# Patient Record
Sex: Female | Born: 1980 | Race: Asian | Hispanic: No | Marital: Married | Smoking: Never smoker
Health system: Southern US, Community
[De-identification: ages and names within clinical notes are randomized; demographics above are authoritative.]

## PROBLEM LIST (undated history)

## (undated) DIAGNOSIS — O09522 Supervision of elderly multigravida, second trimester: Secondary | ICD-10-CM

## (undated) DIAGNOSIS — Z789 Other specified health status: Principal | ICD-10-CM

## (undated) DIAGNOSIS — I10 Essential (primary) hypertension: Secondary | ICD-10-CM

## (undated) HISTORY — DX: Other specified health status: Z78.9

## (undated) HISTORY — DX: Supervision of elderly multigravida, second trimester: O09.522

## (undated) HISTORY — DX: Essential (primary) hypertension: I10

## (undated) HISTORY — PX: NO PAST SURGERIES: SHX2092

---

## 2008-09-10 ENCOUNTER — Inpatient Hospital Stay (HOSPITAL_COMMUNITY): Admission: AD | Admit: 2008-09-10 | Discharge: 2008-09-12 | Payer: Self-pay | Admitting: Obstetrics and Gynecology

## 2009-12-03 ENCOUNTER — Encounter: Payer: Self-pay | Admitting: Physician Assistant

## 2009-12-03 ENCOUNTER — Ambulatory Visit: Payer: Self-pay | Admitting: Obstetrics and Gynecology

## 2009-12-03 LAB — CONVERTED CEMR LAB
Eosinophils Relative: 13 % — ABNORMAL HIGH (ref 0–5)
HCT: 30.5 % — ABNORMAL LOW (ref 36.0–46.0)
Hemoglobin: 9.5 g/dL — ABNORMAL LOW (ref 12.0–15.0)
Hepatitis B Surface Ag: NEGATIVE
Lymphs Abs: 2.1 10*3/uL (ref 0.7–4.0)
MCV: 55.7 fL — ABNORMAL LOW (ref 78.0–100.0)
Monocytes Relative: 6 % (ref 3–12)
Neutro Abs: 4.1 10*3/uL (ref 1.7–7.7)
Neutrophils Relative %: 53 % (ref 43–77)
Platelets: 211 10*3/uL (ref 150–400)

## 2009-12-08 ENCOUNTER — Ambulatory Visit (HOSPITAL_COMMUNITY): Admission: RE | Admit: 2009-12-08 | Discharge: 2009-12-08 | Payer: Self-pay | Admitting: Family Medicine

## 2009-12-10 ENCOUNTER — Ambulatory Visit: Payer: Self-pay | Admitting: Obstetrics and Gynecology

## 2009-12-10 ENCOUNTER — Encounter (INDEPENDENT_AMBULATORY_CARE_PROVIDER_SITE_OTHER): Payer: Self-pay | Admitting: Family Medicine

## 2009-12-11 ENCOUNTER — Encounter (INDEPENDENT_AMBULATORY_CARE_PROVIDER_SITE_OTHER): Payer: Self-pay | Admitting: Family Medicine

## 2009-12-11 ENCOUNTER — Observation Stay (HOSPITAL_COMMUNITY): Admission: RE | Admit: 2009-12-11 | Discharge: 2009-12-11 | Payer: Self-pay | Admitting: Family Medicine

## 2009-12-16 ENCOUNTER — Inpatient Hospital Stay (HOSPITAL_COMMUNITY): Admission: AD | Admit: 2009-12-16 | Discharge: 2009-12-18 | Payer: Self-pay | Admitting: Obstetrics & Gynecology

## 2009-12-16 ENCOUNTER — Ambulatory Visit: Payer: Self-pay | Admitting: Obstetrics & Gynecology

## 2010-02-19 ENCOUNTER — Ambulatory Visit: Payer: Self-pay | Admitting: Obstetrics & Gynecology

## 2010-05-07 ENCOUNTER — Ambulatory Visit
Admission: RE | Admit: 2010-05-07 | Discharge: 2010-05-07 | Payer: Self-pay | Source: Home / Self Care | Attending: Obstetrics and Gynecology | Admitting: Obstetrics and Gynecology

## 2010-07-10 LAB — POCT URINALYSIS DIPSTICK
Bilirubin Urine: NEGATIVE
Ketones, ur: NEGATIVE mg/dL
Nitrite: NEGATIVE
Protein, ur: NEGATIVE mg/dL
Protein, ur: NEGATIVE mg/dL
Specific Gravity, Urine: <=1.005 (ref 1.005–1.030)
Specific Gravity, Urine: 1.01 (ref 1.005–1.030)
Urobilinogen, UA: 0.2 mg/dL (ref 0.0–1.0)
pH: 5 (ref 5.0–8.0)

## 2010-07-10 LAB — CBC
HCT: 30.6 % — ABNORMAL LOW (ref 36.0–46.0)
MCH: 18 pg — ABNORMAL LOW (ref 26.0–34.0)
MCV: 57.3 fL — ABNORMAL LOW (ref 78.0–100.0)
Platelets: 124 10*3/uL — ABNORMAL LOW (ref 150–400)
RDW: 22.7 % — ABNORMAL HIGH (ref 11.5–15.5)
WBC: 16.1 10*3/uL — ABNORMAL HIGH (ref 4.0–10.5)

## 2010-07-10 LAB — AMNISURE RUPTURE OF MEMBRANE (ROM) NOT AT ARMC: Amnisure ROM: POSITIVE

## 2010-07-10 LAB — RPR: RPR Ser Ql: NONREACTIVE

## 2010-07-24 ENCOUNTER — Ambulatory Visit (INDEPENDENT_AMBULATORY_CARE_PROVIDER_SITE_OTHER): Payer: Medicaid Other

## 2010-07-24 DIAGNOSIS — Z3049 Encounter for surveillance of other contraceptives: Secondary | ICD-10-CM

## 2010-08-04 LAB — CBC
Hemoglobin: 9.7 g/dL — ABNORMAL LOW (ref 12.0–15.0)
MCV: 67.7 fL — ABNORMAL LOW (ref 78.0–100.0)
Platelets: 153 10*3/uL (ref 150–400)
Platelets: 183 10*3/uL (ref 150–400)
RBC: 5.56 MIL/uL — ABNORMAL HIGH (ref 3.87–5.11)
RDW: 19.7 % — ABNORMAL HIGH (ref 11.5–15.5)
RDW: 20.1 % — ABNORMAL HIGH (ref 11.5–15.5)
WBC: 15.8 10*3/uL — ABNORMAL HIGH (ref 4.0–10.5)
WBC: 9.2 10*3/uL (ref 4.0–10.5)

## 2010-09-08 NOTE — H&P (Signed)
NAMEKAILYN, VANDERSLICE                  ACCOUNT NO.:  [id number]   MEDICAL RECORD NO.:  [id number]          PATIENT TYPE:  INP   LOCATION:  9169                          FACILITY:  [facility]   PHYSICIAN:  Osborn Coho, M.D.   DATE OF BIRTH:  1981-02-02   DATE OF ADMISSION:  09/10/2008  DATE OF DISCHARGE:                              HISTORY & PHYSICAL   Ms. Deakins is a 30 year old gravida 3, para 2-0-0-2, at 40-6/7 weeks who  presented complaining of uterine contractions every 5 minutes for  several hours.  She denies any leaking or bleeding, and reports positive  fetal movement.  Cervix has been 1 cm in the office.  Group B strep  culture has been negative.  Pregnancy has been remarkable for:   1. Language barrier with patient being Asian.  She does have an      interpreter with her.  2. Group B strep negative.  3. Two previous deliveries in [place] without complications.  4. Late to care.   PRENATAL LABORATORIES:  Blood type is A+, Rh antibody negative, VDRL  nonreactive, __________ titer positive, hepatitis B surface antigen  negative, HIV is nonreactive.  GC and chlamydia cultures were negative  in March.  Glucola was normal.  Hemoglobin upon entering the practice  was 11.6.  It was 10.7 at 29 weeks.  The patient had a group B strep,  GC and chlamydia culture done at 38 weeks.  These were all negative.  The patient presented for care too late for first trimester or second  trimester screening.   HISTORY OF PRESENT PREGNANCY:  The patient entered care at [facility] at 23-5/7 weeks.  Dating was confirmed by ultrasound secondary  to questionable LMP dating.  She had a Glucola at her second visit.  These were normal.  Hemoglobin was 10.7 at 30-31 weeks.  She was  recommended to start iron.  She had __________ in between 31 weeks and  38 weeks.  At 38 weeks she was seen.  Was measuring 33 weeks' size.  She  had an ultrasound showing estimated fetal weight of 6 pounds 8 ounces at  the  26th percentile with normal fluid, and average gestational age was  diagnosed.  Her cervix at that time was closed, 50% vertex, -2.  GBS,  GC, and chlamydia cultures were done.  These were all negative.  The  rest of her pregnancy was essentially uncomplicated.   OBSTETRICAL HISTORY:  In 2001 and in 2003 she had vaginal deliveries in  [place].  These were both home births and had no prenatal care.  She had  no complications per the patient's report.  She did carry both babies to  essentially full term.  She is unsure of the weight.   She has no known medication allergies.   MEDICAL HISTORY:  She has a history of varicose veins.   FAMILY HISTORY:  Noncontributory with no history of hypertension,  diabetes or any other issues.   The patient has never had any surgery.   GENETIC HISTORY:  Remarkable for father of the baby having  mental  retardation.   SOCIAL HISTORY:  The patient is married to the father of the baby.  He  is involved and supportive.  His name is  Merchandiser, retail.  The patient is of  [place] in ethnicity.  She denies religious affiliation.  She speaks  [place].  Her interpreter is Hgiem Ayum.  The patient is currently  unemployed.  Her husband is employed in [employer].  She has  been followed by the Certified Nurse Midwife Service at [facility].  She denies any alcohol, drug or tobacco use during this pregnancy.   PHYSICAL EXAMINATION:  VITAL SIGNS:  Stable.  The patient is afebrile.  HEENT:  Within normal limits.  LUNGS:  Breath sounds are clear.  HEART:  Regular rate and rhythm without murmur.  BREASTS:  Soft and nontender.  ABDOMEN:  Fundal height is approximately 35 cm.  Estimated fetal weight  is 6-7 pounds.  Uterine contractions are currently at present irregular  and mild to moderate.  Fetal heart rate is reactive in cycles with no  decelerations.  Cervix on admission was 2, 60%, vertex -1.  EXTREMITIES:  Deep tendon reflexes are 2+ without  clonus.  There is a trace edema  noted.   IMPRESSION:  1. Intrauterine pregnancy at 40-6/7 weeks.  2. Limited English.  3. Group B strep negative.  4. Early versus prodromal labor.   PLAN:  1. Admitted to birthing suite per consult with Dr. Su Hilt, his      attending physician.  I reviewed the options with the patient and      her interpreter regarding discharge home with a return with      increased contractions or full admission to the hospital with      artificial rupture of membranes as an option.  The patient did wish      to remain in light of her language barrier, her multiparous status,      and her post date status, and elected to have artificial rupture of      membranes.  I agree with this plan.  2. Routine certified nurse midwife orders.  3. Plan IV initiation with artificial rupture of membranes.  Pitocin      __________ will be used p.r.n.  __________ status was reviewed with      the patient interpreter, and they are agreeable with the plan.      Renaldo Reel Emilee Hero, C.N.M.      Osborn Coho, M.D.  Electronically Signed    VLL/MEDQ  D:  09/10/2008  T:  09/10/2008  Job:  [id number]

## 2010-10-09 ENCOUNTER — Ambulatory Visit (INDEPENDENT_AMBULATORY_CARE_PROVIDER_SITE_OTHER): Payer: Medicaid Other

## 2010-10-09 DIAGNOSIS — Z3049 Encounter for surveillance of other contraceptives: Secondary | ICD-10-CM

## 2010-12-11 ENCOUNTER — Encounter: Payer: Self-pay | Admitting: *Deleted

## 2010-12-25 ENCOUNTER — Ambulatory Visit (INDEPENDENT_AMBULATORY_CARE_PROVIDER_SITE_OTHER): Payer: Medicaid Other

## 2010-12-25 VITALS — BP 133/93 | HR 92

## 2010-12-25 DIAGNOSIS — IMO0001 Reserved for inherently not codable concepts without codable children: Secondary | ICD-10-CM

## 2010-12-25 DIAGNOSIS — Z3049 Encounter for surveillance of other contraceptives: Secondary | ICD-10-CM

## 2010-12-25 MED ORDER — MEDROXYPROGESTERONE ACETATE 150 MG/ML IM SUSP
150.0000 mg | Freq: Once | INTRAMUSCULAR | Status: AC
  Administered 2010-12-25: 150 mg via INTRAMUSCULAR

## 2010-12-25 NOTE — Progress Notes (Signed)
Pt was informed via Education officer, community # [phone] that she would need to make a follow up appt involving the Depo Provera regimen that she will have been on it for a year to see if still works for her. I also informed her that the front office will call her with her appt.  Pt stated understanding.

## 2011-05-12 ENCOUNTER — Ambulatory Visit (INDEPENDENT_AMBULATORY_CARE_PROVIDER_SITE_OTHER): Payer: Medicaid Other | Admitting: *Deleted

## 2011-05-12 VITALS — BP 140/99 | HR 84 | Temp 98.7°F

## 2011-05-12 DIAGNOSIS — Z3049 Encounter for surveillance of other contraceptives: Secondary | ICD-10-CM

## 2011-05-12 MED ORDER — MEDROXYPROGESTERONE ACETATE 150 MG/ML IM SUSP
150.0000 mg | Freq: Once | INTRAMUSCULAR | Status: AC
  Administered 2011-05-12: 150 mg via INTRAMUSCULAR

## 2011-05-12 NOTE — Progress Notes (Signed)
Addended by: Sherre Lain A on: 05/12/2011 04:04 PM   Modules accepted: Level of Service

## 2011-05-12 NOTE — Progress Notes (Signed)
Pt in for depo provera on schedule. Will need to see provider prior to next injection.

## 2011-05-12 NOTE — Progress Notes (Signed)
Pts bp elevated today spoke with Wynelle Bourgeois, CNM. She said okay to give depo provera however pt needs a pcp. Spoke with pt using language line gave her info for [facility] to follow up on BP. Pt voices understanding. Also advised that she needs to see MD for her yearly checkup.

## 2011-06-02 ENCOUNTER — Ambulatory Visit (INDEPENDENT_AMBULATORY_CARE_PROVIDER_SITE_OTHER): Payer: Medicaid Other | Admitting: Physician Assistant

## 2011-06-02 ENCOUNTER — Encounter: Payer: Self-pay | Admitting: Physician Assistant

## 2011-06-02 DIAGNOSIS — Z603 Acculturation difficulty: Secondary | ICD-10-CM

## 2011-06-02 DIAGNOSIS — Z0000 Encounter for general adult medical examination without abnormal findings: Secondary | ICD-10-CM

## 2011-06-02 DIAGNOSIS — Z609 Problem related to social environment, unspecified: Secondary | ICD-10-CM

## 2011-06-02 DIAGNOSIS — I10 Essential (primary) hypertension: Secondary | ICD-10-CM

## 2011-06-02 DIAGNOSIS — Z789 Other specified health status: Secondary | ICD-10-CM

## 2011-06-02 HISTORY — DX: Other specified health status: Z78.9

## 2011-06-02 HISTORY — DX: Acculturation difficulty: Z60.3

## 2011-06-02 MED ORDER — HYDROCHLOROTHIAZIDE 25 MG PO TABS: 25.0000 mg | ORAL_TABLET | Freq: Every day | ORAL | Status: DC

## 2011-06-02 NOTE — Progress Notes (Signed)
Chief Complaint:  Gynecologic Exam   Renee Carson is  31 y.o. [id number].  No LMP recorded. Patient has had an injection..   She presents complaining of Gynecologic Exam  No complaints. Last pap 04/2009 no hx of abnormals. No periods with depo.  Obstetrical/Gynecological History: OB History    Grav Para Term Preterm Abortions TAB SAB Ect Mult Living   4 4 4       4       Past Medical History: Past Medical History  Diagnosis Date  . Language Barrier 06/02/2011    Past Surgical History: History reviewed. No pertinent past surgical history.  Family History: History reviewed. No pertinent family history.  Social History: History  Substance Use Topics  . Smoking status: Never Smoker   . Smokeless tobacco: Not on file  . Alcohol Use: No    Allergies: No Known Allergies   Review of Systems - History obtained from chart review and the patient General ROS: negative Psychological ROS: negative Endocrine ROS: negative Breast ROS: negative for breast lumps Respiratory ROS: no cough, shortness of breath, or wheezing Cardiovascular ROS: no chest pain or dyspnea on exertion Gastrointestinal ROS: no abdominal pain, change in bowel habits, or black or bloody stools Genito-Urinary ROS: no dysuria, trouble voiding, or hematuria No period secondary   Physical Exam   Blood pressure 155/101, pulse 123, temperature 97.7 F (36.5 C), temperature source Oral, resp. rate 20, height 5\' 2"  (1.575 m), weight 129 lb 1.6 oz (58.559 kg).  General: General appearance - alert, well appearing, and in no distress, oriented to person, place, and time and normal appearing weight Mental status - alert, oriented to person, place, and time, normal mood, behavior, speech, dress, motor activity, and thought processes, affect appropriate to mood Nose - normal and patent, no erythema, discharge or polyps Mouth - mucous membranes moist, pharynx normal without lesions Neck - supple, no significant adenopathy,  carotids upstroke normal bilaterally, no bruits Lymphatics - no palpable lymphadenopathy, no hepatosplenomegaly Chest - clear to auscultation, no wheezes, rales or rhonchi, symmetric air entry Heart - normal rate, regular rhythm, normal S1, S2, no murmurs, rubs, clicks or gallops Abdomen - soft, nontender, nondistended, no masses or organomegaly Breasts - breasts appear normal, no suspicious masses, no skin or nipple changes or axillary nodes Pelvic - VULVA: normal appearing vulva with no masses, tenderness or lesions, CERVIX: cervical motion tenderness absent, UTERUS: uterus is normal size, shape, consistency and nontender, ADNEXA: normal adnexa in size, nontender and no masses Back exam - full range of motion, no tenderness, palpable spasm or pain on motion Neurological - alert, oriented, normal speech, no focal findings or movement disorder noted, screening mental status exam normal, cranial nerves II through XII intact Musculoskeletal - no joint tenderness, deformity or swelling Extremities - peripheral pulses normal, no pedal edema, no clubbing or cyanosis Skin - facial melasma    Assessment: Well Woman Exam w/o pap Patient Active Problem List  Diagnoses  . Language Barrier  . Hypertension    Plan: Rx HCTZ 25mg  daily Referral to [facility] for PCP needs and HTN management RTC 1 year or prn probs. Pap due 04/2012  Renee Carson E. 06/02/2011,4:30 PM

## 2011-06-02 NOTE — Progress Notes (Signed)
Pt was seen @ our clinic last month for depo provera injection and had elevated blood pressure. She does not have a PCP. She has [employer].  Her last annual exam was Jan 2012.

## 2011-06-02 NOTE — Patient Instructions (Signed)

## 2011-07-28 ENCOUNTER — Ambulatory Visit (INDEPENDENT_AMBULATORY_CARE_PROVIDER_SITE_OTHER): Payer: Medicaid Other

## 2011-07-28 VITALS — BP 139/92

## 2011-07-28 DIAGNOSIS — Z3049 Encounter for surveillance of other contraceptives: Secondary | ICD-10-CM

## 2011-07-28 MED ORDER — MEDROXYPROGESTERONE ACETATE 150 MG/ML IM SUSP
150.0000 mg | Freq: Once | INTRAMUSCULAR | Status: AC
  Administered 2011-07-28: 150 mg via INTRAMUSCULAR

## 2011-10-15 ENCOUNTER — Ambulatory Visit: Payer: Self-pay

## 2011-11-04 ENCOUNTER — Other Ambulatory Visit (INDEPENDENT_AMBULATORY_CARE_PROVIDER_SITE_OTHER): Payer: Self-pay

## 2011-11-04 VITALS — BP 143/97 | HR 82 | Temp 97.0°F | Resp 16 | Wt 126.2 lb

## 2011-11-04 DIAGNOSIS — Z3049 Encounter for surveillance of other contraceptives: Secondary | ICD-10-CM

## 2011-11-04 MED ORDER — MEDROXYPROGESTERONE ACETATE 150 MG/ML IM SUSP
150.0000 mg | Freq: Once | INTRAMUSCULAR | Status: AC
  Administered 2011-11-04: 150 mg via INTRAMUSCULAR

## 2012-01-20 ENCOUNTER — Ambulatory Visit (INDEPENDENT_AMBULATORY_CARE_PROVIDER_SITE_OTHER): Payer: Self-pay | Admitting: Medical

## 2012-01-20 VITALS — BP 140/95

## 2012-01-20 DIAGNOSIS — I10 Essential (primary) hypertension: Secondary | ICD-10-CM

## 2012-01-20 DIAGNOSIS — Z3049 Encounter for surveillance of other contraceptives: Secondary | ICD-10-CM

## 2012-01-20 DIAGNOSIS — Z3042 Encounter for surveillance of injectable contraceptive: Secondary | ICD-10-CM

## 2012-01-20 MED ORDER — MEDROXYPROGESTERONE ACETATE 150 MG/ML IM SUSP
150.0000 mg | INTRAMUSCULAR | Status: DC
  Administered 2012-01-20: 150 mg via INTRAMUSCULAR

## 2012-01-20 MED ORDER — HYDROCHLOROTHIAZIDE 25 MG PO TABS: 25.0000 mg | ORAL_TABLET | Freq: Every day | ORAL | Status: DC

## 2012-04-06 ENCOUNTER — Ambulatory Visit: Payer: Self-pay

## 2012-08-08 ENCOUNTER — Ambulatory Visit (INDEPENDENT_AMBULATORY_CARE_PROVIDER_SITE_OTHER): Payer: Medicaid Other

## 2012-08-08 VITALS — BP 130/93 | HR 90 | Temp 97.8°F | Ht 62.0 in | Wt 127.0 lb

## 2012-08-08 DIAGNOSIS — Z3042 Encounter for surveillance of injectable contraceptive: Secondary | ICD-10-CM

## 2012-08-08 DIAGNOSIS — Z3049 Encounter for surveillance of other contraceptives: Secondary | ICD-10-CM

## 2012-08-08 MED ORDER — MEDROXYPROGESTERONE ACETATE 150 MG/ML IM SUSP
150.0000 mg | Freq: Once | INTRAMUSCULAR | Status: AC
  Administered 2012-08-08: 150 mg via INTRAMUSCULAR

## 2012-10-24 ENCOUNTER — Ambulatory Visit (INDEPENDENT_AMBULATORY_CARE_PROVIDER_SITE_OTHER): Payer: Medicaid Other | Admitting: *Deleted

## 2012-10-24 VITALS — BP 141/88 | HR 80 | Temp 97.0°F | Ht 62.0 in | Wt 125.0 lb

## 2012-10-24 DIAGNOSIS — Z3049 Encounter for surveillance of other contraceptives: Secondary | ICD-10-CM

## 2012-10-24 MED ORDER — MEDROXYPROGESTERONE ACETATE 150 MG/ML IM SUSP
150.0000 mg | Freq: Once | INTRAMUSCULAR | Status: AC
  Administered 2012-10-24: 150 mg via INTRAMUSCULAR

## 2012-10-24 NOTE — Progress Notes (Signed)
Pt informed that prior to her next injection she needs to call for an annual exam. Pt agrees.

## 2013-01-10 ENCOUNTER — Ambulatory Visit (INDEPENDENT_AMBULATORY_CARE_PROVIDER_SITE_OTHER): Payer: Medicaid Other | Admitting: General Practice

## 2013-01-10 VITALS — BP 134/89 | HR 99 | Temp 97.7°F | Ht 62.0 in | Wt 117.4 lb

## 2013-01-10 DIAGNOSIS — Z3049 Encounter for surveillance of other contraceptives: Secondary | ICD-10-CM

## 2013-01-10 DIAGNOSIS — IMO0001 Reserved for inherently not codable concepts without codable children: Secondary | ICD-10-CM

## 2013-01-10 MED ORDER — MEDROXYPROGESTERONE ACETATE 150 MG/ML IM SUSP
150.0000 mg | Freq: Once | INTRAMUSCULAR | Status: AC
  Administered 2013-01-10: 150 mg via INTRAMUSCULAR

## 2013-01-31 ENCOUNTER — Encounter: Payer: Self-pay | Admitting: Family Medicine

## 2013-01-31 ENCOUNTER — Other Ambulatory Visit (HOSPITAL_COMMUNITY)
Admission: RE | Admit: 2013-01-31 | Discharge: 2013-01-31 | Disposition: A | Discharge status: Home / Self Care | Payer: Medicaid Other | Source: Ambulatory Visit | Attending: Family Medicine | Admitting: Family Medicine

## 2013-01-31 ENCOUNTER — Ambulatory Visit (INDEPENDENT_AMBULATORY_CARE_PROVIDER_SITE_OTHER): Payer: Medicaid Other | Admitting: Family Medicine

## 2013-01-31 VITALS — BP 125/87 | HR 93 | Temp 97.7°F | Wt 114.9 lb

## 2013-01-31 DIAGNOSIS — Z609 Problem related to social environment, unspecified: Secondary | ICD-10-CM

## 2013-01-31 DIAGNOSIS — Z0000 Encounter for general adult medical examination without abnormal findings: Secondary | ICD-10-CM

## 2013-01-31 DIAGNOSIS — Z01419 Encounter for gynecological examination (general) (routine) without abnormal findings: Secondary | ICD-10-CM | POA: Insufficient documentation

## 2013-01-31 DIAGNOSIS — Z1151 Encounter for screening for human papillomavirus (HPV): Secondary | ICD-10-CM | POA: Insufficient documentation

## 2013-01-31 DIAGNOSIS — Z23 Encounter for immunization: Secondary | ICD-10-CM

## 2013-01-31 DIAGNOSIS — Z789 Other specified health status: Secondary | ICD-10-CM

## 2013-01-31 MED ORDER — TETANUS-DIPHTH-ACELL PERTUSSIS 5-2.5-18.5 LF-MCG/0.5 IM SUSP
0.5000 mL | Freq: Once | INTRAMUSCULAR | Status: AC
  Administered 2013-01-31: 0.5 mL via INTRAMUSCULAR

## 2013-01-31 NOTE — Patient Instructions (Addendum)
It was great to meet you!

## 2013-01-31 NOTE — Progress Notes (Signed)
  Subjective:     Renee Carson is a 32 y.o. female here for a routine female exam.  Current complaints: none.    Pt has a hx of HTN was on HCTZ but was taken off as her BP was back to normal.    No fevers, chills, HA, cp, sob, abd pain, LEE.    Gynecologic History No LMP recorded. Patient has had an injection. Contraception: Depo-Provera injections Last Pap: 04/2010 and normal. Results were: normal Last mammogram: n/a.   Obstetric History OB History  Gravida Para Term Preterm AB SAB TAB Ectopic Multiple Living  4 4 4       4     # Outcome Date GA Lbr Len/2nd Weight Sex Delivery Anes PTL Lv  4 TRM           3 TRM           2 TRM           1 TRM              Past Medical History  Diagnosis Date  . Language barrier 06/02/2011   History   Social History  . Marital Status: Married    Spouse Name: N/A    Number of Children: N/A  . Years of Education: N/A   Occupational History  . Not on file.   Social History Main Topics  . Smoking status: Never Smoker   . Smokeless tobacco: Not on file  . Alcohol Use: No  . Drug Use: No  . Sexual Activity: Yes    Birth Control/ Protection: Injection   Other Topics Concern  . Not on file   Social History Narrative  . No narrative on file   No family history on file.    Review of Systems A comprehensive review of systems was negative.    Objective:  BP 148/92  Pulse 92  Temp(Src) 97.7 F (36.5 C) (Oral)  Wt 114 lb 14.4 oz (52.118 kg)  BMI 21.01 kg/m2 GENERAL: Well-developed, well-nourished female in no acute distress.  HEENT: Normocephalic, atraumatic. Sclerae anicteric.  NECK: Supple. Normal thyroid.  LUNGS: Clear to auscultation bilaterally.  HEART: Regular rate and rhythm. BREASTS: Symmetric in size. No masses, skin changes, nipple drainage, or lymphadenopathy. ABDOMEN: Soft, nontender, nondistended. No organomegaly. PELVIC: Normal external female genitalia. Vagina is pink and rugated.  Normal discharge. Normal cervix  contour. Pap smear obtained. Uterus is normal in size. No adnexal mass or tenderness.  EXTREMITIES: No cyanosis, clubbing, or edema, 2+ distal pulses.       Assessment:    Healthy female exam.    Plan:    Education reviewed: calcium supplements and hypertension guidelines. PAP done today F/u in 1 year as scheduled TDAP given today

## 2013-02-02 ENCOUNTER — Encounter: Payer: Self-pay | Admitting: *Deleted

## 2013-03-28 ENCOUNTER — Ambulatory Visit (INDEPENDENT_AMBULATORY_CARE_PROVIDER_SITE_OTHER): Payer: Medicaid Other

## 2013-03-28 VITALS — BP 133/92 | HR 76 | Wt 117.2 lb

## 2013-03-28 DIAGNOSIS — Z3049 Encounter for surveillance of other contraceptives: Secondary | ICD-10-CM

## 2013-03-28 MED ORDER — MEDROXYPROGESTERONE ACETATE 104 MG/0.65ML ~~LOC~~ SUSP
104.0000 mg | Freq: Once | SUBCUTANEOUS | Status: AC
  Administered 2013-03-28: 104 mg via SUBCUTANEOUS

## 2013-06-22 ENCOUNTER — Emergency Department (INDEPENDENT_AMBULATORY_CARE_PROVIDER_SITE_OTHER)
Admission: EM | Admit: 2013-06-22 | Discharge: 2013-06-22 | Disposition: A | Discharge status: Home / Self Care | Payer: Worker's Compensation | Source: Home / Self Care | Attending: Emergency Medicine | Admitting: Emergency Medicine

## 2013-06-22 ENCOUNTER — Encounter (HOSPITAL_COMMUNITY): Payer: Self-pay | Admitting: Emergency Medicine

## 2013-06-22 ENCOUNTER — Ambulatory Visit: Payer: Medicaid Other

## 2013-06-22 DIAGNOSIS — S61209A Unspecified open wound of unspecified finger without damage to nail, initial encounter: Secondary | ICD-10-CM | POA: Diagnosis not present

## 2013-06-22 DIAGNOSIS — Z23 Encounter for immunization: Secondary | ICD-10-CM | POA: Diagnosis not present

## 2013-06-22 DIAGNOSIS — Z789 Other specified health status: Secondary | ICD-10-CM

## 2013-06-22 DIAGNOSIS — S61019A Laceration without foreign body of unspecified thumb without damage to nail, initial encounter: Secondary | ICD-10-CM

## 2013-06-22 MED ORDER — TETANUS-DIPHTH-ACELL PERTUSSIS 5-2.5-18.5 LF-MCG/0.5 IM SUSP
INTRAMUSCULAR | Status: AC
  Filled 2013-06-22: qty 0.5

## 2013-06-22 MED ORDER — TETANUS-DIPHTH-ACELL PERTUSSIS 5-2.5-18.5 LF-MCG/0.5 IM SUSP
0.5000 mL | Freq: Once | INTRAMUSCULAR | Status: AC
  Administered 2013-06-22: 0.5 mL via INTRAMUSCULAR

## 2013-06-22 NOTE — Discharge Instructions (Signed)
You may return to work today. Keep dressing dry for 2 days. Remove dressing in 2 days and keep wound covered with a band aide and gloves at work. Return for any concern that the wound appears infected. See information below.  Laceration Care, Adult A laceration is a cut that goes through all layers of the skin. The cut goes into the tissue beneath the skin. HOME CARE For stitches (sutures) or staples:  Keep the cut clean and dry.  If you have a bandage (dressing), change it at least once a day. Change the bandage if it gets wet or dirty, or as told by your doctor.  Wash the cut with soap and water 2 times a day. Rinse the cut with water. Pat it dry with a clean towel.  Put a thin layer of medicated cream on the cut as told by your doctor.  You may shower after the first 24 hours. Do not soak the cut in water until the stitches are removed.  Only take medicines as told by your doctor.  Have your stitches or staples removed as told by your doctor. For skin adhesive strips:  Keep the cut clean and dry.  Do not get the strips wet. You may take a bath, but be careful to keep the cut dry.  If the cut gets wet, pat it dry with a clean towel.  The strips will fall off on their own. Do not remove the strips that are still stuck to the cut. For wound glue:  You may shower or take baths. Do not soak or scrub the cut. Do not swim. Avoid heavy sweating until the glue falls off on its own. After a shower or bath, pat the cut dry with a clean towel.  Do not put medicine on your cut until the glue falls off.  If you have a bandage, do not put tape over the glue.  Avoid lots of sunlight or tanning lamps until the glue falls off. Put sunscreen on the cut for the first year to reduce your scar.  The glue will fall off on its own. Do not pick at the glue. You may need a tetanus shot if:  You cannot remember when you had your last tetanus shot.  You have never had a tetanus shot. If you need  a tetanus shot and you choose not to have one, you may get tetanus. Sickness from tetanus can be serious. GET HELP RIGHT AWAY IF:   Your pain does not get better with medicine.  Your arm, hand, leg, or foot loses feeling (numbness) or changes color.  Your cut is bleeding.  Your joint feels weak, or you cannot use your joint.  You have painful lumps on your body.  Your cut is red, puffy (swollen), or painful.  You have a red line on the skin near the cut.  You have yellowish-white fluid (pus) coming from the cut.  You have a fever.  You have a bad smell coming from the cut or bandage.  Your cut breaks open before or after stitches are removed.  You notice something coming out of the cut, such as wood or glass.  You cannot move a finger or toe. MAKE SURE YOU:   Understand these instructions.  Will watch your condition.  Will get help right away if you are not doing well or get worse. Document Released: 09/29/2007 Document Revised: 07/05/2011 Document Reviewed: 10/06/2010 [facility] Patient Information 2014 [employer], [place].

## 2013-06-22 NOTE — ED Notes (Signed)
Patient /employer unsure about tetanus status

## 2013-06-22 NOTE — ED Notes (Signed)
Left thumb laceration, injured at work.  Injury due to cutting plastic, cut thumb with cutting utensil.  Employer with patient .  Patient needs urine drug screen

## 2013-06-22 NOTE — ED Provider Notes (Signed)
CSN: [id number]     Arrival date & time 06/22/13  1655 History   First MD Initiated Contact with Patient 06/22/13 1919     Chief Complaint  Patient presents with  . Laceration    Patient is a 33 y.o. female presenting with skin laceration. The history is provided by the patient.  Laceration Location:  Finger Finger laceration location:  L thumb Length (cm):  1.5 cm Depth:  Cutaneous Quality: straight   Bleeding: controlled   Pain details:    Severity:  No pain Foreign body present:  No foreign bodies Tetanus status:  Unknown Pt and co-worker report that pt cut her (L) thumb on a very sharp plastic cutting tool at work at approx 1630. The laceration initially bled very heavily and her supervisor requested she be evaluated. Pt has unknown tetanus status.   Past Medical History  Diagnosis Date  . Language barrier 06/02/2011   History reviewed. No pertinent past surgical history. No family history on file. History  Substance Use Topics  . Smoking status: Never Smoker   . Smokeless tobacco: Not on file  . Alcohol Use: No   OB History   Grav Para Term Preterm Abortions TAB SAB Ect Mult Living   4 4 4       4      Review of Systems  All other systems reviewed and are negative.    Allergies  Review of patient's allergies indicates no known allergies.  Home Medications   Current Outpatient Rx  Name  Route  Sig  Dispense  Refill  . medroxyPROGESTERone (DEPO-PROVERA) 150 MG/ML injection   Intramuscular   Inject 150 mg into the muscle every 3 (three) months.          BP 152/96  Pulse 97  Temp(Src) 98.1 F (36.7 C) (Oral)  Resp 16  SpO2 100% Physical Exam  Constitutional: She is oriented to person, place, and time. She appears well-developed and well-nourished.  HENT:  Head: Normocephalic and atraumatic.  Eyes: Conjunctivae are normal.  Cardiovascular: Normal rate.   Pulmonary/Chest: Effort normal.  Musculoskeletal: Normal range of motion.       Hands: Very  superficial approx 1.5 cm laceration to anterior (L) thumb which now has no active bleeding.   Neurological: She is alert and oriented to person, place, and time.  Skin: Skin is warm and dry.  Psychiatric: She has a normal mood and affect.    ED Course  LACERATION REPAIR Date/Time: 06/22/2013 7:51 PM Performed by: Leanne ChangSCHORR, Colston Pyle P Authorized by: Leslee HomeKELLER, DAVID C Consent: Verbal consent obtained. Risks and benefits: risks, benefits and alternatives were discussed Consent given by: patient Patient understanding: patient states understanding of the procedure being performed Patient identity confirmed: verbally with patient Body area: upper extremity Location details: left hand Laceration length: 1.5 cm Foreign bodies: no foreign bodies Tendon involvement: none Nerve involvement: none Vascular damage: no Anesthetic total: 0 ml Patient sedated: no Irrigation solution: saline Irrigation method: syringe Amount of cleaning: standard Debridement: none Degree of undermining: none Skin closure: glue Dressing: gauze roll Comments: Wound closed w/ derma-bond, bulky dressing applied.    (including critical care time) Labs Review Labs Reviewed - No data to display Imaging Review No results found.   MDM  Diagnosis:  (L) thumb laceration  Superficial laceration to (L) anterior thumb. Wound closed w/ dermabond and dressed w/ bulky dressing. T-Dap updated. Home care instructions discussed and provided in writing.    Leanne ChangKatherine P Starlit Raburn, NP 06/22/13 1955

## 2013-06-22 NOTE — ED Provider Notes (Signed)
Medical screening examination/treatment/procedure(s) were performed by non-physician practitioner and as supervising physician I was immediately available for consultation/collaboration.  Minh Roanhorse, M.D.  Makaylah Oddo C Saanya Zieske, MD 06/22/13 2310 

## 2013-06-22 NOTE — ED Notes (Signed)
Left thumb soaking in betadine/saline

## 2013-06-28 ENCOUNTER — Ambulatory Visit (INDEPENDENT_AMBULATORY_CARE_PROVIDER_SITE_OTHER): Payer: BC Managed Care – PPO | Admitting: *Deleted

## 2013-06-28 ENCOUNTER — Encounter: Payer: Self-pay | Admitting: *Deleted

## 2013-06-28 VITALS — BP 136/89 | HR 82 | Ht 62.0 in | Wt 118.8 lb

## 2013-06-28 DIAGNOSIS — Z3049 Encounter for surveillance of other contraceptives: Secondary | ICD-10-CM

## 2013-06-28 MED ORDER — MEDROXYPROGESTERONE ACETATE 104 MG/0.65ML ~~LOC~~ SUSP
104.0000 mg | Freq: Once | SUBCUTANEOUS | Status: AC
  Administered 2013-06-28: 104 mg via SUBCUTANEOUS

## 2013-09-19 ENCOUNTER — Ambulatory Visit (INDEPENDENT_AMBULATORY_CARE_PROVIDER_SITE_OTHER): Payer: BC Managed Care – PPO | Admitting: *Deleted

## 2013-09-19 VITALS — BP 131/89 | HR 88 | Temp 98.4°F

## 2013-09-19 DIAGNOSIS — Z3049 Encounter for surveillance of other contraceptives: Secondary | ICD-10-CM

## 2013-09-19 MED ORDER — MEDROXYPROGESTERONE ACETATE 104 MG/0.65ML ~~LOC~~ SUSP
104.0000 mg | Freq: Once | SUBCUTANEOUS | Status: AC
  Administered 2013-09-19: 104 mg via SUBCUTANEOUS

## 2013-11-26 ENCOUNTER — Ambulatory Visit (INDEPENDENT_AMBULATORY_CARE_PROVIDER_SITE_OTHER): Payer: BC Managed Care – PPO | Admitting: *Deleted

## 2013-11-26 DIAGNOSIS — Z3042 Encounter for surveillance of injectable contraceptive: Secondary | ICD-10-CM

## 2013-11-26 NOTE — Progress Notes (Signed)
Appointment was scheduled too early for the appropriate time.  Pt to return between 8/18-9/2 for Depo Provera 104 mg injection,  Pt verbalizes understanding.

## 2013-12-12 ENCOUNTER — Ambulatory Visit (INDEPENDENT_AMBULATORY_CARE_PROVIDER_SITE_OTHER): Payer: Medicaid Other | Admitting: *Deleted

## 2013-12-12 VITALS — BP 132/85 | HR 82

## 2013-12-12 DIAGNOSIS — Z3049 Encounter for surveillance of other contraceptives: Secondary | ICD-10-CM

## 2013-12-12 MED ORDER — MEDROXYPROGESTERONE ACETATE 104 MG/0.65ML ~~LOC~~ SUSP: 104.0000 mg | Freq: Once | SUBCUTANEOUS | Status: DC

## 2014-02-25 ENCOUNTER — Encounter: Payer: Self-pay | Admitting: *Deleted

## 2014-03-06 ENCOUNTER — Ambulatory Visit (INDEPENDENT_AMBULATORY_CARE_PROVIDER_SITE_OTHER): Payer: BC Managed Care – PPO

## 2014-03-06 DIAGNOSIS — Z3042 Encounter for surveillance of injectable contraceptive: Secondary | ICD-10-CM

## 2014-03-06 MED ORDER — MEDROXYPROGESTERONE ACETATE 104 MG/0.65ML ~~LOC~~ SUSP
104.0000 mg | Freq: Once | SUBCUTANEOUS | Status: AC
  Administered 2014-03-06: 104 mg via SUBCUTANEOUS

## 2014-03-06 NOTE — Progress Notes (Signed)
Patient here today for depo provera inejction-- within appropriate time frame. Depo provera 104mg  administered subcutaneously into RLQ abdomen. Pt. Tolerated well. No questions or concerns. To return between 05/27/14-06/12/14 for next injection.

## 2014-05-27 ENCOUNTER — Ambulatory Visit (INDEPENDENT_AMBULATORY_CARE_PROVIDER_SITE_OTHER): Payer: BC Managed Care – PPO | Admitting: *Deleted

## 2014-05-27 VITALS — BP 137/81 | HR 85

## 2014-05-27 DIAGNOSIS — Z3042 Encounter for surveillance of injectable contraceptive: Secondary | ICD-10-CM

## 2014-05-27 MED ORDER — MEDROXYPROGESTERONE ACETATE 104 MG/0.65ML ~~LOC~~ SUSP
104.0000 mg | Freq: Once | SUBCUTANEOUS | Status: AC
  Administered 2014-05-27: 104 mg via SUBCUTANEOUS

## 2014-08-19 ENCOUNTER — Ambulatory Visit (INDEPENDENT_AMBULATORY_CARE_PROVIDER_SITE_OTHER): Payer: BLUE CROSS/BLUE SHIELD | Admitting: *Deleted

## 2014-08-19 ENCOUNTER — Encounter: Payer: Self-pay | Admitting: *Deleted

## 2014-08-19 VITALS — BP 144/93 | HR 76 | Wt 129.8 lb

## 2014-08-19 DIAGNOSIS — Z3042 Encounter for surveillance of injectable contraceptive: Secondary | ICD-10-CM | POA: Diagnosis not present

## 2014-08-19 MED ORDER — MEDROXYPROGESTERONE ACETATE 104 MG/0.65ML ~~LOC~~ SUSP
104.0000 mg | Freq: Once | SUBCUTANEOUS | Status: AC
  Administered 2014-08-19: 104 mg via SUBCUTANEOUS

## 2014-11-11 ENCOUNTER — Ambulatory Visit (INDEPENDENT_AMBULATORY_CARE_PROVIDER_SITE_OTHER): Payer: BLUE CROSS/BLUE SHIELD

## 2014-11-11 ENCOUNTER — Encounter (HOSPITAL_COMMUNITY): Payer: Self-pay | Admitting: Emergency Medicine

## 2014-11-11 ENCOUNTER — Emergency Department (INDEPENDENT_AMBULATORY_CARE_PROVIDER_SITE_OTHER)
Admission: EM | Admit: 2014-11-11 | Discharge: 2014-11-11 | Disposition: A | Discharge status: Home / Self Care | Payer: BLUE CROSS/BLUE SHIELD | Source: Home / Self Care | Attending: Family Medicine | Admitting: Family Medicine

## 2014-11-11 VITALS — BP 144/103 | HR 85 | Wt 130.2 lb

## 2014-11-11 DIAGNOSIS — R51 Headache: Secondary | ICD-10-CM | POA: Diagnosis not present

## 2014-11-11 DIAGNOSIS — R519 Headache, unspecified: Secondary | ICD-10-CM

## 2014-11-11 DIAGNOSIS — Z3042 Encounter for surveillance of injectable contraceptive: Secondary | ICD-10-CM | POA: Diagnosis not present

## 2014-11-11 DIAGNOSIS — I10 Essential (primary) hypertension: Secondary | ICD-10-CM | POA: Diagnosis not present

## 2014-11-11 MED ORDER — NAPROXEN 375 MG PO TABS: 375.0000 mg | ORAL_TABLET | Freq: Two times a day (BID) | ORAL | Status: DC

## 2014-11-11 MED ORDER — MEDROXYPROGESTERONE ACETATE 150 MG/ML IM SUSP
150.0000 mg | Freq: Once | INTRAMUSCULAR | Status: AC
  Administered 2014-11-11: 150 mg via INTRAMUSCULAR

## 2014-11-11 NOTE — ED Notes (Signed)
Via 34 y/o daughter who speaks [place] Pt c/o HTN associated w/HA onset yest [facility] told to f/u w/PCP Denise CP, SOB, blurry vision,

## 2014-11-11 NOTE — Progress Notes (Signed)
Pt here today for Depo Provera.  Interpreter Jamelle HaringSnow.  Pt presents with elevated BP's.  Pt reports also having headaches and neck pain.  Pt states that she has not been seen by her PCP for 3 years.  Notified Dr. Jolayne Pantheronstant.  Provider recommendation is that she can still receive her Depo injection today, go to ER, or even Urgent Care for BP.  I advised pt as Dr. Jolayne Pantheronstant recommended.  Pt stated that she will go to Urgent Care after leaving the Clinics.  Pt had no further questions.

## 2014-11-11 NOTE — ED Provider Notes (Signed)
CSN: [id number]     Arrival date & time 11/11/14  1320 History   First MD Initiated Contact with Patient 11/11/14 1519     Chief Complaint  Patient presents with  . Hypertension  . Headache   (Consider location/radiation/quality/duration/timing/severity/associated sxs/prior Treatment) HPI Comments: 34 year old [place] female is accompanied by her English-speaking daughter requesting to have her blood pressure taken and for us to start management of her elevated blood pressure. When asked about headache the patient states this morning she got up she had some pain along the posterior left scalp and neck muscles. There is mild tenderness to the left splenius capitis muscles as well as the insertion to the occiput. There is mild pain over the left forehead. She is fully alert awake laughing and smiling and making jokes. Her English is poor but is able to communicate much of what she needs. Is having no problems with vision, speech, hearing, swallowing, focal paresthesias or weakness. She does not work. Sometimes working in the heat causes her to have a headache. Her headaches are rare and never serious.   Past Medical History  Diagnosis Date  . Language barrier 06/02/2011   History reviewed. No pertinent past surgical history. No family history on file. History  Substance Use Topics  . Smoking status: Never Smoker   . Smokeless tobacco: Not on file  . Alcohol Use: No   OB History    Gravida Para Term Preterm AB TAB SAB Ectopic Multiple Living   4 4 4       4      Review of Systems  Constitutional: Negative for fever, activity change and fatigue.  Eyes: Negative.   Respiratory: Negative.   Cardiovascular: Negative.   Gastrointestinal: Negative.   Genitourinary: Negative.   Musculoskeletal: Negative.   Skin: Negative.   Neurological: Positive for headaches. Negative for dizziness, tremors, syncope, facial asymmetry, speech difficulty and numbness.  Psychiatric/Behavioral: Negative.      Allergies  Review of patient's allergies indicates no known allergies.  Home Medications   Prior to Admission medications   Medication Sig Start Date End Date Taking? Authorizing Provider  medroxyPROGESTERone (DEPO-PROVERA) 150 MG/ML injection Inject 150 mg into the muscle every 3 (three) months.    Historical Provider, MD  naproxen (NAPROSYN) 375 MG tablet Take 1 tablet (375 mg total) by mouth 2 (two) times daily. 11/11/14   Hayden Rasmussenavid Shiela Bruns, NP   BP 168/105 mmHg  Pulse 91  Temp(Src) 98.9 F (37.2 C) (Oral)  Resp 20  SpO2 100% Physical Exam  Constitutional: She is oriented to person, place, and time. She appears well-developed and well-nourished. No distress.  HENT:  Bilateral TMs are normal Oropharynx is clear and moist. Soft palate rises symmetrical. Uvula and tongue are midline. Normal swallowing reflex. No intraoral lesions, swelling or erythema.  Eyes: Conjunctivae and EOM are normal. Pupils are equal, round, and reactive to light.  Neck: Normal range of motion. Neck supple.  Cardiovascular: Normal rate, regular rhythm, normal heart sounds and intact distal pulses.   Pulmonary/Chest: Effort normal and breath sounds normal. No respiratory distress. She has no wheezes. She has no rales.  Musculoskeletal: Normal range of motion. She exhibits no edema.  Lymphadenopathy:    She has no cervical adenopathy.  Neurological: She is alert and oriented to person, place, and time. She has normal reflexes. No cranial nerve deficit. She exhibits normal muscle tone. Coordination normal.  Skin: Skin is warm and dry.  Psychiatric: She has a normal mood and affect. Her behavior  is normal. Thought content normal.  Nursing note and vitals reviewed.   ED Course  Procedures (including critical care time) Labs Review Labs Reviewed - No data to display  Imaging Review No results found.   MDM   1. Nonintractable episodic headache, unspecified headache type   2. Essential hypertension     See PCP for BP management. Given instructions and resources. Naprosyn for H/A For worsening go to the ED.     Hayden Rasmussen, NP 11/11/14 [phone]

## 2014-11-11 NOTE — Discharge Instructions (Signed)
Headaches, Frequently Asked Questions °MIGRAINE HEADACHES °Q: What is migraine? What causes it? How can I treat it? °A: Generally, migraine headaches begin as a dull ache. Then they develop into a constant, throbbing, and pulsating pain. You may experience pain at the temples. You may experience pain at the front or back of one or both sides of the head. The pain is usually accompanied by a combination of: °· Nausea. °· Vomiting. °· Sensitivity to light and noise. °Some people (about 15%) experience an aura (see below) before an attack. The cause of migraine is believed to be chemical reactions in the brain. Treatment for migraine may include over-the-counter or prescription medications. It may also include self-help techniques. These include relaxation training and biofeedback.  °Q: What is an aura? °A: About 15% of people with migraine get an "aura". This is a sign of neurological symptoms that occur before a migraine headache. You may see wavy or jagged lines, dots, or flashing lights. You might experience tunnel vision or blind spots in one or both eyes. The aura can include visual or auditory hallucinations (something imagined). It may include disruptions in smell (such as strange odors), taste or touch. Other symptoms include: °· Numbness. °· A "pins and needles" sensation. °· Difficulty in recalling or speaking the correct word. °These neurological events may last as long as 60 minutes. These symptoms will fade as the headache begins. °Q: What is a trigger? °A: Certain physical or environmental factors can lead to or "trigger" a migraine. These include: °· Foods. °· Hormonal changes. °· Weather. °· Stress. °It is important to remember that triggers are different for everyone. To help prevent migraine attacks, you need to figure out which triggers affect you. Keep a headache diary. This is a good way to track triggers. The diary will help you talk to your healthcare professional about your condition. °Q: Does  weather affect migraines? °A: Bright sunshine, hot, humid conditions, and drastic changes in barometric pressure may lead to, or "trigger," a migraine attack in some people. But studies have shown that weather does not act as a trigger for everyone with migraines. °Q: What is the link between migraine and hormones? °A: Hormones start and regulate many of your body's functions. Hormones keep your body in balance within a constantly changing environment. The levels of hormones in your body are unbalanced at times. Examples are during menstruation, pregnancy, or menopause. That can lead to a migraine attack. In fact, about three quarters of all women with migraine report that their attacks are related to the menstrual cycle.  °Q: Is there an increased risk of stroke for migraine sufferers? °A: The likelihood of a migraine attack causing a stroke is very remote. That is not to say that migraine sufferers cannot have a stroke associated with their migraines. In persons under age 40, the most common associated factor for stroke is migraine headache. But over the course of a person's normal life span, the occurrence of migraine headache may actually be associated with a reduced risk of dying from cerebrovascular disease due to stroke.  °Q: What are acute medications for migraine? °A: Acute medications are used to treat the pain of the headache after it has started. Examples over-the-counter medications, NSAIDs, ergots, and triptans.  °Q: What are the triptans? °A: Triptans are the newest class of abortive medications. They are specifically targeted to treat migraine. Triptans are vasoconstrictors. They moderate some chemical reactions in the brain. The triptans work on receptors in your brain. Triptans help   to restore the balance of a neurotransmitter called serotonin. Fluctuations in levels of serotonin are thought to be a main cause of migraine.  °Q: Are over-the-counter medications for migraine effective? °A:  Over-the-counter, or "OTC," medications may be effective in relieving mild to moderate pain and associated symptoms of migraine. But you should see your caregiver before beginning any treatment regimen for migraine.  °Q: What are preventive medications for migraine? °A: Preventive medications for migraine are sometimes referred to as "prophylactic" treatments. They are used to reduce the frequency, severity, and length of migraine attacks. Examples of preventive medications include antiepileptic medications, antidepressants, beta-blockers, calcium channel blockers, and NSAIDs (nonsteroidal anti-inflammatory drugs). °Q: Why are anticonvulsants used to treat migraine? °A: During the past few years, there has been an increased interest in antiepileptic drugs for the prevention of migraine. They are sometimes referred to as "anticonvulsants". Both epilepsy and migraine may be caused by similar reactions in the brain.  °Q: Why are antidepressants used to treat migraine? °A: Antidepressants are typically used to treat people with depression. They may reduce migraine frequency by regulating chemical levels, such as serotonin, in the brain.  °Q: What alternative therapies are used to treat migraine? °A: The term "alternative therapies" is often used to describe treatments considered outside the scope of conventional Western medicine. Examples of alternative therapy include acupuncture, acupressure, and yoga. Another common alternative treatment is herbal therapy. Some herbs are believed to relieve headache pain. Always discuss alternative therapies with your caregiver before proceeding. Some herbal products contain arsenic and other toxins. °TENSION HEADACHES °Q: What is a tension-type headache? What causes it? How can I treat it? °A: Tension-type headaches occur randomly. They are often the result of temporary stress, anxiety, fatigue, or anger. Symptoms include soreness in your temples, a tightening band-like sensation  around your head (a "vice-like" ache). Symptoms can also include a pulling feeling, pressure sensations, and contracting head and neck muscles. The headache begins in your forehead, temples, or the back of your head and neck. Treatment for tension-type headache may include over-the-counter or prescription medications. Treatment may also include self-help techniques such as relaxation training and biofeedback. °CLUSTER HEADACHES °Q: What is a cluster headache? What causes it? How can I treat it? °A: Cluster headache gets its name because the attacks come in groups. The pain arrives with little, if any, warning. It is usually on one side of the head. A tearing or bloodshot eye and a runny nose on the same side of the headache may also accompany the pain. Cluster headaches are believed to be caused by chemical reactions in the brain. They have been described as the most severe and intense of any headache type. Treatment for cluster headache includes prescription medication and oxygen. °SINUS HEADACHES °Q: What is a sinus headache? What causes it? How can I treat it? °A: When a cavity in the bones of the face and skull (a sinus) becomes inflamed, the inflammation will cause localized pain. This condition is usually the result of an allergic reaction, a tumor, or an infection. If your headache is caused by a sinus blockage, such as an infection, you will probably have a fever. An x-ray will confirm a sinus blockage. Your caregiver's treatment might include antibiotics for the infection, as well as antihistamines or decongestants.  °REBOUND HEADACHES °Q: What is a rebound headache? What causes it? How can I treat it? °A: A pattern of taking acute headache medications too often can lead to a condition known as "rebound headache."   A pattern of taking too much headache medication includes taking it more than 2 days per week or in excessive amounts. That means more than the label or a caregiver advises. With rebound  headaches, your medications not only stop relieving pain, they actually begin to cause headaches. Doctors treat rebound headache by tapering the medication that is being overused. Sometimes your caregiver will gradually substitute a different type of treatment or medication. Stopping may be a challenge. Regularly overusing a medication increases the potential for serious side effects. Consult a caregiver if you regularly use headache medications more than 2 days per week or more than the label advises. ADDITIONAL QUESTIONS AND ANSWERS Q: What is biofeedback? A: Biofeedback is a self-help treatment. Biofeedback uses special equipment to monitor your body's involuntary physical responses. Biofeedback monitors:  Breathing.  Pulse.  Heart rate.  Temperature.  Muscle tension.  Brain activity. Biofeedback helps you refine and perfect your relaxation exercises. You learn to control the physical responses that are related to stress. Once the technique has been mastered, you do not need the equipment any more. Q: Are headaches hereditary? A: Four out of five (80%) of people that suffer report a family history of migraine. Scientists are not sure if this is genetic or a family predisposition. Despite the uncertainty, a child has a 50% chance of having migraine if one parent suffers. The child has a 75% chance if both parents suffer.  Q: Can children get headaches? A: By the time they reach high school, most young people have experienced some type of headache. Many safe and effective approaches or medications can prevent a headache from occurring or stop it after it has begun.  Q: What type of doctor should I see to diagnose and treat my headache? A: Start with your primary caregiver. Discuss his or her experience and approach to headaches. Discuss methods of classification, diagnosis, and treatment. Your caregiver may decide to recommend you to a headache specialist, depending upon your symptoms or other  physical conditions. Having diabetes, allergies, etc., may require a more comprehensive and inclusive approach to your headache. The National Headache Foundation will provide, upon request, a list of [facility] physician members in your state. Document Released: 07/03/2003 Document Revised: 07/05/2011 Document Reviewed: 12/11/2007 [facility] Patient Information 2015 [place], [place]. This information is not intended to replace advice given to you by your health care provider. Make sure you discuss any questions you have with your health care provider.  Hypertension Hypertension is another name for high blood pressure. High blood pressure forces your heart to work harder to pump blood. A blood pressure reading has two numbers, which includes a higher number over a lower number (example: 110/72). HOME CARE   Have your blood pressure rechecked by your doctor.  Only take medicine as told by your doctor. Follow the directions carefully. The medicine does not work as well if you skip doses. Skipping doses also puts you at risk for problems.  Do not smoke.  Monitor your blood pressure at home as told by your doctor. GET HELP IF:  You think you are having a reaction to the medicine you are taking.  You have repeat headaches or feel dizzy.  You have puffiness (swelling) in your ankles.  You have trouble with your vision. GET HELP RIGHT AWAY IF:   You get a very bad headache and are confused.  You feel weak, numb, or faint.  You get chest or belly (abdominal) pain.  You throw up (vomit).  You  cannot breathe very well. MAKE SURE YOU:   Understand these instructions.  Will watch your condition.  Will get help right away if you are not doing well or get worse. Document Released: 09/29/2007 Document Revised: 04/17/2013 Document Reviewed: 02/02/2013 [facility]elandExitCare Patient Information 2015 [place], [place]. This information is not intended to replace advice given to you by your health care provider. Make  sure you discuss any questions you have with your health care provider.

## 2015-01-27 ENCOUNTER — Ambulatory Visit (INDEPENDENT_AMBULATORY_CARE_PROVIDER_SITE_OTHER): Payer: BLUE CROSS/BLUE SHIELD | Admitting: General Practice

## 2015-01-27 VITALS — BP 145/96 | HR 105 | Temp 98.4°F | Wt 128.6 lb

## 2015-01-27 DIAGNOSIS — Z3042 Encounter for surveillance of injectable contraceptive: Secondary | ICD-10-CM | POA: Diagnosis not present

## 2015-01-27 MED ORDER — MEDROXYPROGESTERONE ACETATE 150 MG/ML IM SUSP
150.0000 mg | Freq: Once | INTRAMUSCULAR | Status: AC
  Administered 2015-01-27: 150 mg via INTRAMUSCULAR

## 2015-04-14 ENCOUNTER — Ambulatory Visit: Payer: Self-pay

## 2017-03-07 ENCOUNTER — Encounter: Payer: Self-pay | Admitting: *Deleted

## 2017-03-07 ENCOUNTER — Ambulatory Visit: Payer: Self-pay | Admitting: *Deleted

## 2017-03-07 DIAGNOSIS — Z3200 Encounter for pregnancy test, result unknown: Secondary | ICD-10-CM

## 2017-03-07 DIAGNOSIS — Z3492 Encounter for supervision of normal pregnancy, unspecified, second trimester: Secondary | ICD-10-CM

## 2017-03-07 DIAGNOSIS — O09522 Supervision of elderly multigravida, second trimester: Secondary | ICD-10-CM

## 2017-03-07 DIAGNOSIS — Z3201 Encounter for pregnancy test, result positive: Secondary | ICD-10-CM

## 2017-03-07 DIAGNOSIS — O0932 Supervision of pregnancy with insufficient antenatal care, second trimester: Secondary | ICD-10-CM

## 2017-03-07 LAB — POCT PREGNANCY, URINE: Preg Test, Ur: POSITIVE — AB

## 2017-03-07 NOTE — Progress Notes (Signed)
I have reviewed the nurses note, and agree with the plan of care.  Thressa ShellerHeather Sinia Antosh 1:18 PM 03/07/17

## 2017-03-07 NOTE — Progress Notes (Signed)
Pt informed of +UPT today.  Pt's 36 yo  daughter used as interpreter due to pt speaks [place] and no interpreter available. She reports LMP was during the first week of July. She reports feeling fetal movement and denies any pain or bleeding. Medication reconciliation completed. US scheduled 11/20 @ 0930 for dates and anatomy.  Pt will be called with New Ob appt info.

## 2017-03-15 ENCOUNTER — Other Ambulatory Visit: Payer: Self-pay | Admitting: Obstetrics & Gynecology

## 2017-03-15 ENCOUNTER — Encounter (HOSPITAL_COMMUNITY): Payer: Self-pay | Admitting: *Deleted

## 2017-03-15 ENCOUNTER — Ambulatory Visit (HOSPITAL_COMMUNITY)
Admission: RE | Admit: 2017-03-15 | Discharge: 2017-03-15 | Disposition: A | Discharge status: Home / Self Care | Payer: Medicaid Other | Source: Ambulatory Visit | Attending: Obstetrics & Gynecology | Admitting: Obstetrics & Gynecology

## 2017-03-15 DIAGNOSIS — Z3200 Encounter for pregnancy test, result unknown: Secondary | ICD-10-CM

## 2017-03-15 DIAGNOSIS — Z3492 Encounter for supervision of normal pregnancy, unspecified, second trimester: Secondary | ICD-10-CM

## 2017-03-15 DIAGNOSIS — Z3A20 20 weeks gestation of pregnancy: Secondary | ICD-10-CM

## 2017-03-15 DIAGNOSIS — O0932 Supervision of pregnancy with insufficient antenatal care, second trimester: Secondary | ICD-10-CM

## 2017-03-15 DIAGNOSIS — O09522 Supervision of elderly multigravida, second trimester: Secondary | ICD-10-CM

## 2017-03-15 DIAGNOSIS — Z363 Encounter for antenatal screening for malformations: Secondary | ICD-10-CM | POA: Diagnosis not present

## 2017-03-15 NOTE — ED Notes (Signed)
Language resources interpreter with pt.

## 2017-03-16 NOTE — Addendum Note (Signed)
Encounter addended by: Adam PhenixArnold, James G, MD on: 03/16/2017 8:41 AM  Actions taken: Problem List reviewed

## 2017-04-05 ENCOUNTER — Encounter: Payer: Self-pay | Admitting: Family Medicine

## 2017-04-05 ENCOUNTER — Encounter: Payer: Self-pay | Admitting: Advanced Practice Midwife

## 2017-04-22 ENCOUNTER — Ambulatory Visit (INDEPENDENT_AMBULATORY_CARE_PROVIDER_SITE_OTHER): Payer: Medicaid Other | Admitting: Certified Nurse Midwife

## 2017-04-22 ENCOUNTER — Other Ambulatory Visit (HOSPITAL_COMMUNITY)
Admission: RE | Admit: 2017-04-22 | Discharge: 2017-04-22 | Disposition: A | Discharge status: Home / Self Care | Payer: Medicaid Other | Source: Ambulatory Visit | Attending: Advanced Practice Midwife | Admitting: Advanced Practice Midwife

## 2017-04-22 ENCOUNTER — Encounter: Payer: Self-pay | Admitting: Certified Nurse Midwife

## 2017-04-22 DIAGNOSIS — O282 Abnormal cytological finding on antenatal screening of mother: Secondary | ICD-10-CM | POA: Diagnosis not present

## 2017-04-22 DIAGNOSIS — Z1151 Encounter for screening for human papillomavirus (HPV): Secondary | ICD-10-CM | POA: Insufficient documentation

## 2017-04-22 DIAGNOSIS — O09522 Supervision of elderly multigravida, second trimester: Secondary | ICD-10-CM | POA: Insufficient documentation

## 2017-04-22 DIAGNOSIS — Z3A25 25 weeks gestation of pregnancy: Secondary | ICD-10-CM | POA: Insufficient documentation

## 2017-04-22 DIAGNOSIS — Z3480 Encounter for supervision of other normal pregnancy, unspecified trimester: Secondary | ICD-10-CM

## 2017-04-22 DIAGNOSIS — O0993 Supervision of high risk pregnancy, unspecified, third trimester: Secondary | ICD-10-CM | POA: Insufficient documentation

## 2017-04-22 DIAGNOSIS — Z3482 Encounter for supervision of other normal pregnancy, second trimester: Secondary | ICD-10-CM

## 2017-04-22 DIAGNOSIS — O0992 Supervision of high risk pregnancy, unspecified, second trimester: Secondary | ICD-10-CM

## 2017-04-22 HISTORY — DX: Supervision of elderly multigravida, second trimester: O09.522

## 2017-04-22 LAB — POCT URINALYSIS DIP (DEVICE)
BILIRUBIN URINE: NEGATIVE
Glucose, UA: NEGATIVE mg/dL
KETONES UR: NEGATIVE mg/dL
Leukocytes, UA: NEGATIVE
Nitrite: NEGATIVE
PH: 7 (ref 5.0–8.0)
Protein, ur: NEGATIVE mg/dL
Specific Gravity, Urine: 1.015 (ref 1.005–1.030)
Urobilinogen, UA: 0.2 mg/dL (ref 0.0–1.0)

## 2017-04-22 MED ORDER — PREPLUS 27-1 MG PO TABS: 1.0000 | ORAL_TABLET | Freq: Every day | ORAL | 13 refills | Status: DC

## 2017-04-22 NOTE — Progress Notes (Signed)
Here for initial pregnatal visit. Given new patient information packet. Not eligible for [employer] due to speaks [place]. [employer] , no interpreter available. Patient speaks some [place], and daughter with her who speaks [place] and [place]. Declines flu shot.

## 2017-04-22 NOTE — Progress Notes (Signed)
Addendum: PMH form completed.

## 2017-04-22 NOTE — Patient Instructions (Signed)
AREA PEDIATRIC/FAMILY PRACTICE PHYSICIANS  [facility] [address], [address] [place], [place]  [address] Phone - [phone]   Fax - [phone]  [facility] [address] [place], [place] [address] Phone - [phone]   Fax - [phone]  [facility] [address] [place], [place]  [address] Phone - [phone]   Fax - [phone]  [facility] [address] [place], [place]  [address] Phone - [phone]   Fax - [phone]  [facility] [address] [place], [place]  [address] Phone - [phone]   Fax - [phone]  [facility] [address] [place], [place]  [address] Phone - [phone]   Fax - [phone]  [facility] [address] [place], [place]  [address] Phone - [phone]   Fax - [phone]  [facility] [address], [address] [place], [place]  [address] Phone - [phone]   Fax - [phone]  [facility] [address] [place], [place]  [address] Phone - [phone]   Fax - [phone] [facility] [address] [place], [place]  [address] Phone - [phone]   Fax - [phone]  [facility] [address] [place], [place]  [address] Phone - [phone]   Fax - [phone]  [facility] [address] [place], [place]  [address] Phone - [phone]   Fax - [phone]  [facility] [address] [place], [place]  [address] Phone - [phone]   Fax - [phone]  [facility]IL[facility] GOSRANI [address] [place], [place]  [address] Phone - [phone]  [facility] [address] [place], [place]  [address] Phone - [phone]   Fax - [phone]  [facility] [address] [place], [place]  [address] Phone - [phone]   Fax - [phone]  [facility] [address] [place], [place]  [address] Phone - [phone]   Fax - [phone]  [facility] [address] [place], [place]  [address] Phone - [phone]   Fax - [phone]   [facility] [address] [place], [place]  [address] Phone - [phone]   Fax - [phone]  [facility] [address] [place], [place]  [address] Phone - [phone]   Fax - [phone]  DAVID RUBIN [address] [place], [place]  [address] Phone - [phone]   Fax - [phone]  [facility] [address] [place], [place]  [address] Phone - [phone]   Fax - [phone]  [facility] [address] , [place]  [address] Phone - [phone]   Fax - [phone] [facility] [address] [place], [place]  [address] Phone - [phone]   Fax - [phone]  [facility] [address] [place], [place]  [address] Phone - [phone]   Fax - [phone]  [facility] [address] [place], [place]  [address] Phone - [phone]   Fax - [phone]  [facility] Charlene Flemming MD [address] [place] [address] Phone [phone]  Fax [phone]   Second Trimester of Pregnancy The second trimester is from week 13 through week 28,   month 4 through 6. This is often the time in pregnancy that you feel your best. Often times, morning sickness has lessened or quit. You may have more energy, and you may get hungry more often. Your unborn baby (fetus) is growing rapidly. At the end of the sixth month, he or she is about 9 inches long and weighs about 1 pounds. You will likely feel the baby move (quickening) between 18 and 20 weeks of  pregnancy. Follow these instructions at home:  Avoid all smoking, herbs, and alcohol. Avoid drugs not approved by your doctor.  Do not use any tobacco products, including cigarettes, chewing tobacco, and electronic cigarettes. If you need help quitting, ask your doctor. You may get counseling or other support to help you quit.  Only take medicine as told by your doctor. Some medicines are safe and some are not during pregnancy.  Exercise only as told by your doctor. Stop exercising if you start having cramps.  Eat regular, healthy meals.  Wear a good support bra if your breasts are tender.  Do not use hot tubs, steam rooms, or saunas.  Wear your seat belt when driving.  Avoid raw meat, uncooked cheese, and liter boxes and soil used by cats.  Take your prenatal vitamins.  Take 1500-2000 milligrams of calcium daily starting at the 20th week of pregnancy until you deliver your baby.  Try taking medicine that helps you poop (stool softener) as needed, and if your doctor approves. Eat more fiber by eating fresh fruit, vegetables, and whole grains. Drink enough fluids to keep your pee (urine) clear or pale yellow.  Take warm water baths (sitz baths) to soothe pain or discomfort caused by hemorrhoids. Use hemorrhoid cream if your doctor approves.  If you have puffy, bulging veins (varicose veins), wear support hose. Raise (elevate) your feet for 15 minutes, 3-4 times a day. Limit salt in your diet.  Avoid heavy lifting, wear low heals, and sit up straight.  Rest with your legs raised if you have leg cramps or low back pain.  Visit your dentist if you have not gone during your pregnancy. Use a soft toothbrush to brush your teeth. Be gentle when you floss.  You can have sex (intercourse) unless your doctor tells you not to.  Go to your doctor visits. Get help if:  You feel dizzy.  You have mild cramps or pressure in your lower belly (abdomen).  You have a nagging pain in your  belly area.  You continue to feel sick to your stomach (nauseous), throw up (vomit), or have watery poop (diarrhea).  You have bad smelling fluid coming from your vagina.  You have pain with peeing (urination). Get help right away if:  You have a fever.  You are leaking fluid from your vagina.  You have spotting or bleeding from your vagina.  You have severe belly cramping or pain.  You lose or gain weight rapidly.  You have trouble catching your breath and have chest pain.  You notice sudden or extreme puffiness (swelling) of your face, hands, ankles, feet, or legs.  You have not felt the baby move in over an hour.  You have severe headaches that do not go away with medicine.  You have vision changes. This information is not intended to replace advice given to you by your health care provider. Make sure you discuss any questions you have with your health care provider. Document Released: 07/07/2009 Document Revised: 09/18/2015 Document Reviewed: 06/13/2012 Elsevier Interactive Patient   Education  2017 Elsevier Inc.  

## 2017-04-22 NOTE — Progress Notes (Signed)
Subjective:   Renee Carson is a 36 y.o. [id number] at 3641w5d by LMP being seen today for her first obstetrical visit.  Her obstetrical history is significant for advanced maternal age. Patient does intend to breast feed. Pregnancy history fully reviewed.  Patient reports no complaints.  HISTORY: Obstetric History   G5   P4   T4   P0   A0   L4    SAB0   TAB0   Ectopic0   Multiple0   Live Births4     # Outcome Date GA Lbr Len/2nd Weight Sex Delivery Anes PTL Lv  5 Current           4 Term 2011     Vag-Spont   LIV  3 Term 2010     Vag-Spont   LIV  2 Term 2003     Vag-Spont   LIV  1 Term 2001     Vag-Spont   LIV     Past Medical History:  Diagnosis Date  . Hypertension    resolved  . Language barrier 06/02/2011  . Medical history non-contributory    Past Surgical History:  Procedure Laterality Date  . NO PAST SURGERIES     History reviewed. No pertinent family history. Social History   Tobacco Use  . Smoking status: Never Smoker  . Smokeless tobacco: Never Used  Substance Use Topics  . Alcohol use: No  . Drug use: No   No Known Allergies No current outpatient medications on file prior to visit.   No current facility-administered medications on file prior to visit.     Exam   Vitals:   04/22/17 0928  BP: 131/87  Pulse: (!) 102  Weight: 129 lb 12.8 oz (58.9 kg)   Fetal Heart Rate (bpm): 154  Uterus:  Fundal Height: 21 cm  Pelvic Exam: Perineum: no hemorrhoids, normal perineum   Vulva: normal external genitalia, no lesions   Vagina:  normal mucosa, normal discharge   Cervix: no lesions and normal, pap smear done today.    Adnexa: normal adnexa and no mass, fullness, tenderness   Bony Pelvis: average  System: General: well-developed, well-nourished female in no acute distress   Breast:  normal appearance, no masses or tenderness   Skin: normal coloration and turgor, no rashes   Neurologic: oriented, normal, negative, normal mood   Extremities: normal  strength, tone, and muscle mass, ROM of all joints is normal   Neck supple and no masses   Cardiovascular: regular rate and rhythm   Respiratory:  no respiratory distress, normal breath sounds   Abdomen: soft, non-tender; bowel sounds normal; no masses,  no organomegaly, gravid appropriate      Assessment:   Pregnancy: [id number] Patient Active Problem List   Diagnosis Date Noted  . Supervision of other normal pregnancy, antepartum 04/22/2017  . Advanced maternal age in multigravida, second trimester 04/22/2017  . Supervision of high risk pregnancy, antepartum, second trimester 04/22/2017  . Language barrier 06/02/2011     Plan:  1. Supervision of other normal pregnancy, antepartum -High risk pregnancy due to AMA of 36 at delivery, otherwise normal pregnancy  - POCT urinalysis dip (device) - Obstetric Panel, Including HIV - Hemoglobinopathy Evaluation - Cystic fibrosis gene test - Cytology - PAP - Culture, OB Urine - Prenatal Vit-Fe Fumarate-FA (PREPLUS) 27-1 MG TABS; Take 1 tablet by mouth daily.  Dispense: 30 tablet; Refill: 13  2. Advanced maternal age in multigravida, second trimester -Discussed and educated on genetic screening  and coverage with AMA- patient declines all genetic screening - Prenatal Vit-Fe Fumarate-FA (PREPLUS) 27-1 MG TABS; Take 1 tablet by mouth daily.  Dispense: 30 tablet; Refill: 13   Initial labs drawn. Prescribed prenatal vitamins- patient was not taking any prenatal vitamins. Genetic Screening discussed, AFP and  NIPS: declined. Ultrasound discussed; fetal anatomic survey: results reviewed. Problem list reviewed and updated. The nature of [facility] with multiple MDs and other Advanced Practice Providers was explained to patient; also emphasized that residents, students are part of our team. Routine obstetric precautions reviewed. Return in about 3 weeks (around 05/13/2017) for ROB/2hrGTT/3TL,.    Sharyon CableVeronica C  Love Chowning, CNM 04/22/17, 10:27 AM

## 2017-04-24 LAB — URINE CULTURE, OB REFLEX: Organism ID, Bacteria: NO GROWTH

## 2017-04-24 LAB — CULTURE, OB URINE

## 2017-04-26 NOTE — L&D Delivery Note (Addendum)
Patient is a 37 y.o. now G5P5005 s/p NSVD at 7514w6d, who was admitted for SOL.  She progressed4 with augmentation with Pitocin to complete. Cord clamping delayed by 1 minute then clamped by me with supervision and cut by nurse of the patient.   She requests condoms for birth control.  Delivery Note At 12:59 AM a viable female was delivered via Vaginal, Spontaneous (Presentation: LOA) in usual fashion.  APGAR: 8, 9; weight pending.   Placenta status: intact, manually extracted.  Cord: 3V with the following complications: tight nuchal x1.  Cord pH: N/A  Retained placenta manually extracted by Dr. Doroteo GlassmanPhelps 37 minutes after delivery. Noted to have large clots with placental removal concerning for abruption. Placenta also with signs of abruption with ecchymosis. Placenta inspected and intact. Bleeding continued to be heavy and uterus large and boggy. Code hemorrhage called. Cytotec oral 400mcg and rectal 600mcg, 250mcg hemabate, and TXA given. Dr. Emelda FearFerguson performed uterine cutterage via US guidance with clots removed but no missing cotyledons or retained tissue.  No abrasions or lacerations noted requiring repair. CBC and coag panel obtained.  Anesthesia: epidural Episiotomy: None Lacerations: None Suture Repair: None Est. Blood Loss (mL):  1318  Mom to postpartum.  Baby to Couplet care / Skin to Skin.  [place] DenseAlison Rumball, DO 07/23/17, 1:29 AM  OB FELLOW DELIVERY ATTESTATION  I was gloved and present for the delivery in its entirety, and I agree with the above resident's note with edits made.    Caryl AdaJazma Phelps, DO OB Fellow 2:49 AM  I was called to the delivery due to postpartum hemorrhage after removal of the placenta.Marland Kitchen.  Upon arrival uterine massage and transabdominal bedside ultrasound performed.  The uterus is very thick with abnormally thick walls.  In order to rule out retained cotyledons of brief endometrial curettage was performed under ultrasound guidance and no tissue remnants obtained.   Continued massage and, followed by vaginal packing were successful.  Patient remained somewhat tachycardic.  After establishment of second IV line and fluid hydration was sufficient but I decided transfusion times 2 units were warranted.  She was given Hemabate 125 mcg IM.  She had nausea and vomiting, GI discomfort secondary to the Hemabate. Vaginal packing was left in place for 2 hours and removed by me at 3 AM    `````Attestation of Attending Supervision of Advanced Practitioner: Evaluation and management procedures were performed by the PA/NP/CNM/OB Fellow under my supervision/collaboration. Chart reviewed and agree with management and plan.  Tilda BurrowJohn V Lillia Lengel 07/24/2017 5:00 PM

## 2017-04-27 LAB — HEMOGLOBINOPATHY EVALUATION
Ferritin: 7 ng/mL — ABNORMAL LOW (ref 15–150)
Hematocrit: 34.2 % (ref 34.0–46.6)
Hemoglobin: 11.2 g/dL (ref 11.1–15.9)
Hgb A2 Quant: 3 % (ref 1.8–3.2)
Hgb A: 72.8 % — ABNORMAL LOW (ref 96.4–98.8)
Hgb C: 0 %
Hgb F Quant: 0 % (ref 0.0–2.0)
Hgb S: 0 %
Hgb Solubility: NEGATIVE
Hgb Variant: 24.2 % — ABNORMAL HIGH
MCH: 22.7 pg — ABNORMAL LOW (ref 26.6–33.0)
MCHC: 32.7 g/dL (ref 31.5–35.7)
MCV: 69 fL — ABNORMAL LOW (ref 79–97)
Platelets: 226 10*3/uL (ref 150–379)
RBC: 4.94 x10E6/uL (ref 3.77–5.28)
RDW: 14.5 % (ref 12.3–15.4)
WBC: 7.9 10*3/uL (ref 3.4–10.8)

## 2017-04-28 LAB — CYTOLOGY - PAP
Chlamydia: NEGATIVE
Diagnosis: UNDETERMINED — AB
HPV: NOT DETECTED
Neisseria Gonorrhea: NEGATIVE

## 2017-04-29 LAB — OBSTETRIC PANEL, INCLUDING HIV
Basophils Absolute: 0 10*3/uL (ref 0.0–0.2)
Basos: 1 %
EOS (ABSOLUTE): 0.6 10*3/uL — ABNORMAL HIGH (ref 0.0–0.4)
Eos: 7 %
HIV Screen 4th Generation wRfx: NONREACTIVE
Hematocrit: 32.8 % — ABNORMAL LOW (ref 34.0–46.6)
Hemoglobin: 10.9 g/dL — ABNORMAL LOW (ref 11.1–15.9)
Hepatitis B Surface Ag: NEGATIVE
Immature Grans (Abs): 0 10*3/uL (ref 0.0–0.1)
Immature Granulocytes: 0 %
Lymphocytes Absolute: 1.7 10*3/uL (ref 0.7–3.1)
Lymphs: 21 %
MCH: 22.4 pg — ABNORMAL LOW (ref 26.6–33.0)
MCHC: 33.2 g/dL (ref 31.5–35.7)
MCV: 67 fL — ABNORMAL LOW (ref 79–97)
Monocytes Absolute: 0.4 10*3/uL (ref 0.1–0.9)
Monocytes: 5 %
Neutrophils Absolute: 5.5 10*3/uL (ref 1.4–7.0)
Neutrophils: 66 %
Platelets: 218 10*3/uL (ref 150–379)
RBC: 4.87 x10E6/uL (ref 3.77–5.28)
RDW: 14 % (ref 12.3–15.4)
RPR Ser Ql: NONREACTIVE
Rh Factor: POSITIVE
Rubella Antibodies, IGG: 1.96 index (ref >0.99)
WBC: 8.3 10*3/uL (ref 3.4–10.8)

## 2017-04-29 LAB — CYSTIC FIBROSIS GENE TEST

## 2017-04-29 LAB — AB SCR+ANTIBODY ID

## 2017-05-03 ENCOUNTER — Encounter: Payer: Self-pay | Admitting: *Deleted

## 2017-05-12 ENCOUNTER — Encounter: Payer: Self-pay | Admitting: Obstetrics and Gynecology

## 2017-05-12 ENCOUNTER — Ambulatory Visit (INDEPENDENT_AMBULATORY_CARE_PROVIDER_SITE_OTHER): Payer: Medicaid Other | Admitting: Obstetrics and Gynecology

## 2017-05-12 VITALS — BP 141/93 | HR 98 | Wt 129.6 lb

## 2017-05-12 DIAGNOSIS — O0993 Supervision of high risk pregnancy, unspecified, third trimester: Secondary | ICD-10-CM | POA: Diagnosis not present

## 2017-05-12 DIAGNOSIS — Z23 Encounter for immunization: Secondary | ICD-10-CM | POA: Diagnosis not present

## 2017-05-12 DIAGNOSIS — Z789 Other specified health status: Secondary | ICD-10-CM | POA: Diagnosis not present

## 2017-05-12 DIAGNOSIS — O09522 Supervision of elderly multigravida, second trimester: Secondary | ICD-10-CM

## 2017-05-12 DIAGNOSIS — O0933 Supervision of pregnancy with insufficient antenatal care, third trimester: Secondary | ICD-10-CM

## 2017-05-12 DIAGNOSIS — R87610 Atypical squamous cells of undetermined significance on cytologic smear of cervix (ASC-US): Secondary | ICD-10-CM

## 2017-05-12 DIAGNOSIS — O09523 Supervision of elderly multigravida, third trimester: Secondary | ICD-10-CM | POA: Diagnosis not present

## 2017-05-12 DIAGNOSIS — O10913 Unspecified pre-existing hypertension complicating pregnancy, third trimester: Secondary | ICD-10-CM | POA: Diagnosis not present

## 2017-05-12 DIAGNOSIS — O0930 Supervision of pregnancy with insufficient antenatal care, unspecified trimester: Secondary | ICD-10-CM

## 2017-05-12 DIAGNOSIS — O10919 Unspecified pre-existing hypertension complicating pregnancy, unspecified trimester: Secondary | ICD-10-CM

## 2017-05-12 HISTORY — DX: Atypical squamous cells of undetermined significance on cytologic smear of cervix (ASC-US): R87.610

## 2017-05-12 LAB — POCT URINALYSIS DIP (DEVICE)
BILIRUBIN URINE: NEGATIVE
GLUCOSE, UA: NEGATIVE mg/dL
KETONES UR: NEGATIVE mg/dL
LEUKOCYTES UA: NEGATIVE
Nitrite: NEGATIVE
Protein, ur: 30 mg/dL — AB
Urobilinogen, UA: 0.2 mg/dL (ref 0.0–1.0)
pH: 6.5 (ref 5.0–8.0)

## 2017-05-12 NOTE — Progress Notes (Signed)
Pt had a small amount of blood on the tissue when she urinated this morning 28 week/ tdap today

## 2017-05-12 NOTE — Progress Notes (Signed)
Prenatal Visit Note Date: 05/12/2017 Clinic: [facility]  Subjective:  Renee Carson is a [age] y.o. [id number] at 5345w4d being seen today for ongoing prenatal care.  She is currently monitored for the following issues for this high-risk pregnancy and has Language barrier; Supervision of other normal pregnancy, antepartum; Advanced maternal age in multigravida, second trimester; Supervision of high risk pregnancy, antepartum, second trimester; and Chronic hypertension during pregnancy, antepartum on their problem list.  Patient reports scratched herself when she was wiping and noted some blood. she's pretty sure it's external.  She denies any s/s of pre-x Contractions: Not present. Vag. Bleeding: None, Scant.  Movement: Present. Denies leaking of fluid.   The following portions of the patient's history were reviewed and updated as appropriate: allergies, current medications, past family history, past medical history, past social history, past surgical history and problem list. Problem list updated.  Objective:   Vitals:   05/12/17 0849 05/12/17 0851  BP: (!) 135/93 (!) 141/93  Pulse: 98   Weight: 129 lb 9.6 oz (58.8 kg)     Fetal Status: Fetal Heart Rate (bpm): 140s Fundal Height: 27 cm Movement: Present     General:  Alert, oriented and cooperative. Patient is in no acute distress.  Skin: Skin is warm and dry. No rash noted.   Cardiovascular: Normal heart rate noted  Respiratory: Normal respiratory effort, no problems with respiration noted  Abdomen: Soft, gravid, appropriate for gestational age. Pain/Pressure: Present     Pelvic:  Cervical exam performed Dilation: Closed Effacement (%): Thick Station: Ballotable  EGBUS with small scratched on right labia majora, non bleeding, not tender. Vault and cervix negative  Extremities: Normal range of motion.  Edema: None  Mental Status: Normal mood and affect. Normal behavior. Normal judgment and thought content.  Ctab, normal  s1 and s2, no mrgs 2+ brachial Abdomen: nttp  Urinalysis:      Assessment and Plan:  Pregnancy: G5P4004 at 945w4d  1. Supervision of high risk pregnancy, antepartum, third trimester Routine care.  - Glucose Tolerance, 2 Hours w/1 Hour - CBC - HIV antibody (with reflex) - RPR - CMP and Liver - Protein / Creatinine Ratio, Urine - US MFM OB FOLLOW UP; Future  2. cHTN Patient states when she came here for her depo provera shots she was told she had HTN but at her PCP they were normal. D/w her recommend doing cHTN dx based on her BPs in the system. Will get growth u/s and recommend 32wk ap testing which she is amenable to. Will get baseline labs today. Too late to start baby asa. 1wk bp only check. Will hold off on meds for now since just barely mild range and appears to be where she was in the past.   3. Need for diphtheria-tetanus-pertussis (Tdap) vaccine - Tdap vaccine greater than or equal to 7yo IM  interpreter used.   Preterm labor symptoms and general obstetric precautions including but not limited to vaginal bleeding, contractions, leaking of fluid and fetal movement were reviewed in detail with the patient. Please refer to After Visit Summary for other counseling recommendations.  Return in about 1 week (around 05/19/2017) for bp only check. 2wk hrob.    BingPickens, Oasis Goehring, MD

## 2017-05-13 LAB — CBC
Hematocrit: 34.6 % (ref 34.0–46.6)
Hemoglobin: 11.3 g/dL (ref 11.1–15.9)
MCH: 22.4 pg — AB (ref 26.6–33.0)
MCHC: 32.7 g/dL (ref 31.5–35.7)
MCV: 69 fL — ABNORMAL LOW (ref 79–97)
Platelets: 221 10*3/uL (ref 150–379)
RBC: 5.05 x10E6/uL (ref 3.77–5.28)
RDW: 15.3 % (ref 12.3–15.4)
WBC: 6.8 10*3/uL (ref 3.4–10.8)

## 2017-05-13 LAB — HIV ANTIBODY (ROUTINE TESTING W REFLEX): HIV SCREEN 4TH GENERATION: NONREACTIVE

## 2017-05-13 LAB — RPR: RPR Ser Ql: NONREACTIVE

## 2017-05-13 LAB — GLUCOSE TOLERANCE, 2 HOURS W/ 1HR
GLUCOSE, 1 HOUR: 97 mg/dL (ref 65–179)
GLUCOSE, FASTING: 75 mg/dL (ref 65–91)
Glucose, 2 hour: 102 mg/dL (ref 65–152)

## 2017-05-18 ENCOUNTER — Other Ambulatory Visit (HOSPITAL_COMMUNITY): Payer: Self-pay | Admitting: *Deleted

## 2017-05-18 ENCOUNTER — Other Ambulatory Visit: Payer: Self-pay | Admitting: Obstetrics and Gynecology

## 2017-05-18 ENCOUNTER — Ambulatory Visit (HOSPITAL_COMMUNITY)
Admission: RE | Admit: 2017-05-18 | Discharge: 2017-05-18 | Disposition: A | Discharge status: Home / Self Care | Payer: Medicaid Other | Source: Ambulatory Visit | Attending: Obstetrics and Gynecology | Admitting: Obstetrics and Gynecology

## 2017-05-18 ENCOUNTER — Encounter (HOSPITAL_COMMUNITY): Payer: Self-pay

## 2017-05-18 DIAGNOSIS — O0993 Supervision of high risk pregnancy, unspecified, third trimester: Secondary | ICD-10-CM

## 2017-05-18 DIAGNOSIS — O10919 Unspecified pre-existing hypertension complicating pregnancy, unspecified trimester: Secondary | ICD-10-CM

## 2017-05-18 DIAGNOSIS — Z3A29 29 weeks gestation of pregnancy: Secondary | ICD-10-CM

## 2017-05-18 DIAGNOSIS — Z362 Encounter for other antenatal screening follow-up: Secondary | ICD-10-CM

## 2017-05-18 DIAGNOSIS — Z3A32 32 weeks gestation of pregnancy: Secondary | ICD-10-CM | POA: Insufficient documentation

## 2017-05-18 DIAGNOSIS — O09523 Supervision of elderly multigravida, third trimester: Secondary | ICD-10-CM

## 2017-05-20 ENCOUNTER — Ambulatory Visit: Payer: Medicaid Other

## 2017-05-20 VITALS — BP 134/85 | HR 106

## 2017-05-20 DIAGNOSIS — O0990 Supervision of high risk pregnancy, unspecified, unspecified trimester: Secondary | ICD-10-CM

## 2017-05-20 DIAGNOSIS — Z0130 Encounter for examination of blood pressure without abnormal findings: Secondary | ICD-10-CM

## 2017-05-20 DIAGNOSIS — Z8679 Personal history of other diseases of the circulatory system: Secondary | ICD-10-CM

## 2017-05-20 NOTE — Progress Notes (Signed)
Patient presented to the office for a BP check and labs. BP was 134/85. Pt advised to f/u at next visit.

## 2017-05-20 NOTE — Progress Notes (Signed)
Chart reviewed for nurse visit. Agree with plan of care.   Phelps, Jazma Y, DO 05/20/2017 3:49 PM   

## 2017-05-21 LAB — CMP AND LIVER
ALBUMIN: 3.7 g/dL (ref 3.5–5.5)
ALK PHOS: 102 IU/L (ref 39–117)
ALT: 5 IU/L (ref 0–32)
AST: 9 IU/L (ref 0–40)
BILIRUBIN TOTAL: 0.3 mg/dL (ref 0.0–1.2)
BUN: 5 mg/dL — ABNORMAL LOW (ref 6–20)
Bilirubin, Direct: 0.08 mg/dL (ref 0.00–0.40)
CHLORIDE: 105 mmol/L (ref 96–106)
CO2: 20 mmol/L (ref 20–29)
Calcium: 8.5 mg/dL — ABNORMAL LOW (ref 8.7–10.2)
Creatinine, Ser: 0.5 mg/dL — ABNORMAL LOW (ref 0.57–1.00)
GFR calc Af Amer: 143 mL/min/{1.73_m2} (ref >59)
GFR, EST NON AFRICAN AMERICAN: 124 mL/min/{1.73_m2} (ref >59)
GLUCOSE: 93 mg/dL (ref 65–99)
POTASSIUM: 3.5 mmol/L (ref 3.5–5.2)
Sodium: 141 mmol/L (ref 134–144)
Total Protein: 6.8 g/dL (ref 6.0–8.5)

## 2017-05-21 LAB — PROTEIN / CREATININE RATIO, URINE
CREATININE, UR: 67.8 mg/dL
PROTEIN UR: 12.6 mg/dL
Protein/Creat Ratio: 186 mg/g creat (ref 0–200)

## 2017-05-21 LAB — TSH: TSH: 1.65 u[IU]/mL (ref 0.450–4.500)

## 2017-05-24 LAB — TSH

## 2017-05-24 LAB — CMP AND LIVER

## 2017-05-24 LAB — PROTEIN / CREATININE RATIO, URINE

## 2017-05-27 ENCOUNTER — Ambulatory Visit (INDEPENDENT_AMBULATORY_CARE_PROVIDER_SITE_OTHER): Payer: Medicaid Other | Admitting: Obstetrics and Gynecology

## 2017-05-27 ENCOUNTER — Encounter: Payer: Self-pay | Admitting: Obstetrics and Gynecology

## 2017-05-27 VITALS — BP 125/94 | HR 93 | Wt 129.0 lb

## 2017-05-27 DIAGNOSIS — O09522 Supervision of elderly multigravida, second trimester: Secondary | ICD-10-CM

## 2017-05-27 DIAGNOSIS — O0993 Supervision of high risk pregnancy, unspecified, third trimester: Secondary | ICD-10-CM

## 2017-05-27 DIAGNOSIS — N879 Dysplasia of cervix uteri, unspecified: Secondary | ICD-10-CM

## 2017-05-27 DIAGNOSIS — O10919 Unspecified pre-existing hypertension complicating pregnancy, unspecified trimester: Secondary | ICD-10-CM

## 2017-05-27 DIAGNOSIS — Z789 Other specified health status: Secondary | ICD-10-CM

## 2017-05-27 LAB — POCT URINALYSIS DIP (DEVICE)
Glucose, UA: NEGATIVE mg/dL
Hgb urine dipstick: NEGATIVE
LEUKOCYTES UA: NEGATIVE
NITRITE: NEGATIVE
PH: 7.5 (ref 5.0–8.0)
Protein, ur: 30 mg/dL — AB
Specific Gravity, Urine: 1.015 (ref 1.005–1.030)
UROBILINOGEN UA: 1 mg/dL (ref 0.0–1.0)

## 2017-05-27 NOTE — Progress Notes (Signed)
Prenatal Visit Note Date: 05/27/2017 Clinic: [facility]  Subjective:  Renee Carson is a 37 y.o. [id number] at 124w5d being seen today for ongoing prenatal care.  She is currently monitored for the following issues for this high-risk pregnancy and has Language barrier; Supervision of high risk pregnancy, antepartum, third trimester; Advanced maternal age in multigravida, second trimester; Chronic hypertension during pregnancy, antepartum; Cervix dysplasia; and Late prenatal care on their problem list.  Patient reports no complaints.   Contractions: Not present. Vag. Bleeding: None.  Movement: Present. Denies leaking of fluid.   The following portions of the patient's history were reviewed and updated as appropriate: allergies, current medications, past family history, past medical history, past social history, past surgical history and problem list. Problem list updated.  Objective:   Vitals:   05/27/17 0909  BP: (!) 125/94  Pulse: 93  Weight: 129 lb (58.5 kg)    Fetal Status: Fetal Heart Rate (bpm): 142   Movement: Present     General:  Alert, oriented and cooperative. Patient is in no acute distress.  Skin: Skin is warm and dry. No rash noted.   Cardiovascular: Normal heart rate noted  Respiratory: Normal respiratory effort, no problems with respiration noted  Abdomen: Soft, gravid, appropriate for gestational age. Pain/Pressure: Present     Pelvic:  Cervical exam deferred        Extremities: Normal range of motion.  Edema: None  Mental Status: Normal mood and affect. Normal behavior. Normal judgment and thought content.   Urinalysis:      Assessment and Plan:  Pregnancy: G5P4004 at 634w5d  1. Chronic hypertension during pregnancy, antepartum Doing well on no meds. Growth u/s neg last week. Has serial bpps scheduled with mfm. Will do nsts on same day here  2. Cervix dysplasia Rpt pap late 2019  3. Supervision of high risk pregnancy, antepartum, third  trimester Routine care. D/w pt re: BC nv  4. Advanced maternal age in multigravida, second trimester No issues   5. Language barrier Interpreter used  Preterm labor symptoms and general obstetric precautions including but not limited to vaginal bleeding, contractions, leaking of fluid and fetal movement were reviewed in detail with the patient. Please refer to After Visit Summary for other counseling recommendations.  Return in about 12 days (around 06/08/2017) for low risk ob and nst.   [place] BingPickens, Donesha Wallander, MD

## 2017-06-08 ENCOUNTER — Ambulatory Visit (HOSPITAL_COMMUNITY): Payer: Medicaid Other

## 2017-06-08 ENCOUNTER — Encounter (HOSPITAL_COMMUNITY): Payer: Self-pay

## 2017-06-08 ENCOUNTER — Ambulatory Visit: Payer: Self-pay

## 2017-06-08 ENCOUNTER — Ambulatory Visit (INDEPENDENT_AMBULATORY_CARE_PROVIDER_SITE_OTHER): Payer: Medicaid Other | Admitting: Obstetrics & Gynecology

## 2017-06-08 ENCOUNTER — Ambulatory Visit (INDEPENDENT_AMBULATORY_CARE_PROVIDER_SITE_OTHER): Payer: Medicaid Other | Admitting: *Deleted

## 2017-06-08 VITALS — BP 135/85 | HR 101 | Wt 129.9 lb

## 2017-06-08 DIAGNOSIS — O10913 Unspecified pre-existing hypertension complicating pregnancy, third trimester: Secondary | ICD-10-CM

## 2017-06-08 DIAGNOSIS — O10919 Unspecified pre-existing hypertension complicating pregnancy, unspecified trimester: Secondary | ICD-10-CM

## 2017-06-08 DIAGNOSIS — O0993 Supervision of high risk pregnancy, unspecified, third trimester: Secondary | ICD-10-CM

## 2017-06-08 NOTE — Progress Notes (Signed)

## 2017-06-08 NOTE — Progress Notes (Signed)
   PRENATAL VISIT NOTE  Subjective:  Renee Carson is a 37 y.o. [id number] at 5258w3d being seen today for ongoing prenatal care.  Due to language barrier, a [place] interpreter was present during the history-taking and subsequent discussion (and for part of the physical exam) with this patient.  She is currently monitored for the following issues for this high-risk pregnancy and has Language barrier; Supervision of high risk pregnancy, antepartum, third trimester; Advanced maternal age in multigravida, second trimester; Chronic hypertension during pregnancy, antepartum; Cervix dysplasia; and Late prenatal care on their problem list.  Patient reports no complaints.  Contractions: Not present. Vag. Bleeding: None.  Movement: Present. Denies leaking of fluid.   The following portions of the patient's history were reviewed and updated as appropriate: allergies, current medications, past family history, past medical history, past social history, past surgical history and problem list. Problem list updated.  Objective:   Vitals:   06/08/17 1054  BP: 135/85  Pulse: (!) 101  Weight: 129 lb 14.4 oz (58.9 kg)    Fetal Status: Fetal Heart Rate (bpm): NST   Movement: Present     General:  Alert, oriented and cooperative. Patient is in no acute distress.  Skin: Skin is warm and dry. No rash noted.   Cardiovascular: Normal heart rate noted  Respiratory: Normal respiratory effort, no problems with respiration noted  Abdomen: Soft, gravid, appropriate for gestational age.  Pain/Pressure: Present     Pelvic: Cervical exam deferred        Extremities: Normal range of motion.  Edema: None  Mental Status:  Normal mood and affect. Normal behavior. Normal judgment and thought content.   Assessment and Plan:  Pregnancy: G5P4004 at 6058w3d  1. Chronic hypertension during pregnancy, antepartum Stable BP, no meds. NST performed today was reviewed and was found to be reactive. Subsequent BPP performed today was also  reviewed and was found to be 10/10. AFI was also normal. Continue recommended antenatal testing and prenatal care.  2. Supervision of high risk pregnancy, antepartum, third trimester Preterm labor symptoms and general obstetric precautions including but not limited to vaginal bleeding, contractions, leaking of fluid and fetal movement were reviewed in detail with the patient. Please refer to After Visit Summary for other counseling recommendations.  Return in about 7 days (around 06/15/2017) for Pt has US @ 0845 - she needs NST only @ 1015 (change time to 20 min), Ob fu w/[place] @ 1035.   Jaynie CollinsUgonna Yahya Boldman, MD

## 2017-06-08 NOTE — Progress Notes (Signed)
[facility] for encounter.

## 2017-06-10 ENCOUNTER — Encounter: Payer: Self-pay | Admitting: Obstetrics and Gynecology

## 2017-06-14 ENCOUNTER — Encounter: Payer: Self-pay | Admitting: Obstetrics & Gynecology

## 2017-06-14 ENCOUNTER — Other Ambulatory Visit: Payer: Self-pay

## 2017-06-15 ENCOUNTER — Ambulatory Visit (INDEPENDENT_AMBULATORY_CARE_PROVIDER_SITE_OTHER): Payer: Medicaid Other | Admitting: Obstetrics & Gynecology

## 2017-06-15 ENCOUNTER — Ambulatory Visit (INDEPENDENT_AMBULATORY_CARE_PROVIDER_SITE_OTHER): Payer: Medicaid Other | Admitting: *Deleted

## 2017-06-15 ENCOUNTER — Ambulatory Visit (HOSPITAL_COMMUNITY)
Admission: RE | Admit: 2017-06-15 | Discharge: 2017-06-15 | Disposition: A | Discharge status: Home / Self Care | Payer: Medicaid Other | Source: Ambulatory Visit | Attending: Obstetrics and Gynecology | Admitting: Obstetrics and Gynecology

## 2017-06-15 VITALS — BP 124/79 | HR 93 | Wt 130.4 lb

## 2017-06-15 DIAGNOSIS — O10913 Unspecified pre-existing hypertension complicating pregnancy, third trimester: Secondary | ICD-10-CM

## 2017-06-15 DIAGNOSIS — Z3A33 33 weeks gestation of pregnancy: Secondary | ICD-10-CM | POA: Diagnosis not present

## 2017-06-15 DIAGNOSIS — O10919 Unspecified pre-existing hypertension complicating pregnancy, unspecified trimester: Secondary | ICD-10-CM | POA: Diagnosis present

## 2017-06-15 DIAGNOSIS — Z362 Encounter for other antenatal screening follow-up: Secondary | ICD-10-CM | POA: Insufficient documentation

## 2017-06-15 DIAGNOSIS — O09893 Supervision of other high risk pregnancies, third trimester: Secondary | ICD-10-CM | POA: Diagnosis not present

## 2017-06-15 DIAGNOSIS — O0993 Supervision of high risk pregnancy, unspecified, third trimester: Secondary | ICD-10-CM

## 2017-06-15 DIAGNOSIS — O10013 Pre-existing essential hypertension complicating pregnancy, third trimester: Secondary | ICD-10-CM | POA: Diagnosis not present

## 2017-06-15 DIAGNOSIS — R87610 Atypical squamous cells of undetermined significance on cytologic smear of cervix (ASC-US): Secondary | ICD-10-CM

## 2017-06-15 LAB — POCT URINALYSIS DIP (DEVICE)
Bilirubin Urine: NEGATIVE
GLUCOSE, UA: NEGATIVE mg/dL
Hgb urine dipstick: NEGATIVE
Ketones, ur: NEGATIVE mg/dL
LEUKOCYTES UA: NEGATIVE
NITRITE: NEGATIVE
PROTEIN: NEGATIVE mg/dL
Specific Gravity, Urine: 1.01 (ref 1.005–1.030)
UROBILINOGEN UA: 0.2 mg/dL (ref 0.0–1.0)
pH: 6 (ref 5.0–8.0)

## 2017-06-15 NOTE — Progress Notes (Signed)
[name] present for encounter.  

## 2017-06-15 NOTE — Progress Notes (Signed)
   PRENATAL VISIT NOTE  Subjective:  Renee Carson is a 37 y.o. [id number] at 8816w3d being seen today for ongoing prenatal care.  She is currently monitored for the following issues for this high-risk pregnancy and has Language barrier; Supervision of high risk pregnancy, antepartum, third trimester; Advanced maternal age in multigravida, second trimester; Chronic hypertension during pregnancy, antepartum; ASCUS of cervix with negative high risk HPV; and Late prenatal care on their problem list.  Patient reports no complaints.  Contractions: Irregular. Vag. Bleeding: None.  Movement: Present. Denies leaking of fluid.   The following portions of the patient's history were reviewed and updated as appropriate: allergies, current medications, past family history, past medical history, past social history, past surgical history and problem list. Problem list updated.  Objective:   Vitals:   06/15/17 1030  BP: 124/79  Pulse: 93  Weight: 130 lb 6.4 oz (59.1 kg)    Fetal Status: Fetal Heart Rate (bpm): NST Fundal Height: 31 cm Movement: Present     General:  Alert, oriented and cooperative. Patient is in no acute distress.  Skin: Skin is warm and dry. No rash noted.   Cardiovascular: Normal heart rate noted  Respiratory: Normal respiratory effort, no problems with respiration noted  Abdomen: Soft, gravid, appropriate for gestational age.  Pain/Pressure: Present     Pelvic: Cervical exam deferred        Extremities: Normal range of motion.  Edema: None  Mental Status:  Normal mood and affect. Normal behavior. Normal judgment and thought content.   Assessment and Plan:  Pregnancy: G5P4004 at 6416w3d  1. ASCUS of cervix with negative high risk HPV   2. Supervision of high risk pregnancy, antepartum, third trimester Normal BP, normal growth  3. Chronic hypertension during pregnancy, antepartum NST reactive  Preterm labor symptoms and general obstetric precautions including but not limited to  vaginal bleeding, contractions, leaking of fluid and fetal movement were reviewed in detail with the patient. Please refer to After Visit Summary for other counseling recommendations.  Return in about 6 days (around 06/21/2017) for as scheduled; **Please schedule HOB on 3/12 @ 1115 w/Wenzel and cancel appt on 3/13 w/Phelps.   Scheryl DarterJames Arnold, MD

## 2017-06-15 NOTE — Patient Instructions (Signed)

## 2017-06-21 ENCOUNTER — Ambulatory Visit (INDEPENDENT_AMBULATORY_CARE_PROVIDER_SITE_OTHER): Payer: Medicaid Other | Admitting: *Deleted

## 2017-06-21 ENCOUNTER — Ambulatory Visit: Payer: Self-pay

## 2017-06-21 ENCOUNTER — Ambulatory Visit (INDEPENDENT_AMBULATORY_CARE_PROVIDER_SITE_OTHER): Payer: Medicaid Other | Admitting: Obstetrics & Gynecology

## 2017-06-21 VITALS — BP 133/93 | HR 94 | Wt 132.0 lb

## 2017-06-21 DIAGNOSIS — O09523 Supervision of elderly multigravida, third trimester: Secondary | ICD-10-CM

## 2017-06-21 DIAGNOSIS — O0993 Supervision of high risk pregnancy, unspecified, third trimester: Secondary | ICD-10-CM

## 2017-06-21 DIAGNOSIS — O10919 Unspecified pre-existing hypertension complicating pregnancy, unspecified trimester: Secondary | ICD-10-CM

## 2017-06-21 LAB — POCT URINALYSIS DIP (DEVICE)
Bilirubin Urine: NEGATIVE
Glucose, UA: NEGATIVE mg/dL
Hgb urine dipstick: NEGATIVE
Ketones, ur: NEGATIVE mg/dL
Leukocytes, UA: NEGATIVE
Nitrite: NEGATIVE
Protein, ur: NEGATIVE mg/dL
Specific Gravity, Urine: <=1.005 (ref 1.005–1.030)
Urobilinogen, UA: 0.2 mg/dL (ref 0.0–1.0)
pH: 6 (ref 5.0–8.0)

## 2017-06-21 NOTE — Progress Notes (Signed)

## 2017-06-21 NOTE — Progress Notes (Signed)
   PRENATAL VISIT NOTE  Subjective:  Renee Carson is a 37 y.o. [id number] at 7895w2d being seen today for ongoing prenatal care.  She is currently monitored for the following issues for this low-risk pregnancy and has Language barrier; Supervision of high risk pregnancy, antepartum, third trimester; Advanced maternal age in multigravida, second trimester; Chronic hypertension during pregnancy, antepartum; ASCUS of cervix with negative high risk HPV; and Late prenatal care on their problem list.  Patient reports no complaints.  Contractions: Irregular. Vag. Bleeding: None.  Movement: Present. Denies leaking of fluid.   The following portions of the patient's history were reviewed and updated as appropriate: allergies, current medications, past family history, past medical history, past social history, past surgical history and problem list. Problem list updated.  Objective:   Vitals:   06/21/17 0914  BP: (!) 133/93  Pulse: 94  Weight: 132 lb (59.9 kg)    Fetal Status: Fetal Heart Rate (bpm): NST   Movement: Present     General:  Alert, oriented and cooperative. Patient is in no acute distress.  Skin: Skin is warm and dry. No rash noted.   Cardiovascular: Normal heart rate noted  Respiratory: Normal respiratory effort, no problems with respiration noted  Abdomen: Soft, gravid, appropriate for gestational age.  Pain/Pressure: Present     Pelvic: Cervical exam deferred        Extremities: Normal range of motion.  Edema: None  Mental Status:  Normal mood and affect. Normal behavior. Normal judgment and thought content.   Assessment and Plan:  Pregnancy: G5P4004 at 3295w2d  1. Supervision of high risk pregnancy, antepartum, third trimester Good fetal surveillance  2. Chronic hypertension during pregnancy, antepartum No medication  3. Advanced maternal age in multigravida, third trimester   Preterm labor symptoms and general obstetric precautions including but not limited to vaginal  bleeding, contractions, leaking of fluid and fetal movement were reviewed in detail with the patient. Please refer to After Visit Summary for other counseling recommendations.  Return in about 7 days (around 06/28/2017) for as scheduled **Please change appt on 3/19 to 3/20 for NST only @ 1015 - pt has US @ 0845.   Scheryl DarterJames Malaya Cagley, MD

## 2017-06-21 NOTE — Progress Notes (Signed)
[name] present for encounter. Pt denies H/A or visual disturbances.

## 2017-06-21 NOTE — Patient Instructions (Signed)

## 2017-06-22 ENCOUNTER — Other Ambulatory Visit (HOSPITAL_COMMUNITY): Payer: Self-pay

## 2017-06-28 ENCOUNTER — Ambulatory Visit (INDEPENDENT_AMBULATORY_CARE_PROVIDER_SITE_OTHER): Payer: Medicaid Other | Admitting: Obstetrics and Gynecology

## 2017-06-28 ENCOUNTER — Encounter: Payer: Self-pay | Admitting: Obstetrics and Gynecology

## 2017-06-28 ENCOUNTER — Ambulatory Visit (INDEPENDENT_AMBULATORY_CARE_PROVIDER_SITE_OTHER): Payer: Medicaid Other | Admitting: General Practice

## 2017-06-28 ENCOUNTER — Other Ambulatory Visit: Payer: Self-pay

## 2017-06-28 ENCOUNTER — Ambulatory Visit: Payer: Medicaid Other

## 2017-06-28 VITALS — BP 137/88 | HR 99 | Wt 131.0 lb

## 2017-06-28 DIAGNOSIS — O10913 Unspecified pre-existing hypertension complicating pregnancy, third trimester: Secondary | ICD-10-CM | POA: Diagnosis not present

## 2017-06-28 DIAGNOSIS — O10919 Unspecified pre-existing hypertension complicating pregnancy, unspecified trimester: Secondary | ICD-10-CM

## 2017-06-28 DIAGNOSIS — O0993 Supervision of high risk pregnancy, unspecified, third trimester: Secondary | ICD-10-CM | POA: Diagnosis not present

## 2017-06-28 DIAGNOSIS — Z603 Acculturation difficulty: Secondary | ICD-10-CM

## 2017-06-28 DIAGNOSIS — Z789 Other specified health status: Secondary | ICD-10-CM

## 2017-06-28 DIAGNOSIS — O09523 Supervision of elderly multigravida, third trimester: Secondary | ICD-10-CM | POA: Diagnosis not present

## 2017-06-28 DIAGNOSIS — O09522 Supervision of elderly multigravida, second trimester: Secondary | ICD-10-CM

## 2017-06-28 NOTE — Progress Notes (Signed)
I have reviewed this chart and agree with the RN/CMA assessment and management.    Eleana Tocco C Minnetta Sandora, MD, FACOG Attending Physician, [facility]  

## 2017-06-28 NOTE — Progress Notes (Signed)
Prenatal Visit Note Date: 06/28/2017 Clinic: [facility]  Subjective:  Renee Carson is a 37 y.o. G5P4004 at 2727w2d being seen today for ongoing prenatal care.  She is currently monitored for the following issues for this high-risk pregnancy and has Language barrier; Supervision of high risk pregnancy, antepartum, third trimester; Advanced maternal age in multigravida, second trimester; Chronic hypertension during pregnancy, antepartum; ASCUS of cervix with negative high risk HPV; and Late prenatal care on their problem list.  Patient reports no complaints.   Contractions: Irregular. Vag. Bleeding: None.  Movement: Present. Denies leaking of fluid.   The following portions of the patient's history were reviewed and updated as appropriate: allergies, current medications, past family history, past medical history, past social history, past surgical history and problem list. Problem list updated.  Objective:   Vitals:   06/28/17 1047 06/28/17 1048  BP: (!) 142/93 137/88  Pulse: 99   Weight: 131 lb (59.4 kg)     Fetal Status: Fetal Heart Rate (bpm): NST   Movement: Present  Presentation: Vertex  General:  Alert, oriented and cooperative. Patient is in no acute distress.  Skin: Skin is warm and dry. No rash noted.   Cardiovascular: Normal heart rate noted  Respiratory: Normal respiratory effort, no problems with respiration noted  Abdomen: Soft, gravid, appropriate for gestational age. Pain/Pressure: Present     Pelvic:  Cervical exam deferred        Extremities: Normal range of motion.  Edema: None  Mental Status: Normal mood and affect. Normal behavior. Normal judgment and thought content.   Urinalysis:      Assessment and Plan:  Pregnancy: G5P4004 at 5927w2d  1. Supervision of high risk pregnancy, antepartum, third trimester Routine care. GBS nv. Had HAs with prior OCPs. D/w her IUD types and may be a good fit for her  2. Chronic hypertension during pregnancy,  antepartum Doing well on no meds. bpp 10/10 today. Continue with ap testing  3. Advanced maternal age in multigravida, third trimester No issues  4. Language barrier Interpreter used  Preterm labor symptoms and general obstetric precautions including but not limited to vaginal bleeding, contractions, leaking of fluid and fetal movement were reviewed in detail with the patient. Please refer to After Visit Summary for other counseling recommendations.  Return in about 7 days (around 07/05/2017) for as scheduled.   [place] BingPickens, Renee Bajorek, MD

## 2017-06-28 NOTE — Progress Notes (Signed)
Pt informed that the ultrasound is considered a limited OB ultrasound and is not intended to be a complete ultrasound exam.  Patient also informed that the ultrasound is not being completed with the intent of assessing for fetal or placental anomalies or any pelvic abnormalities.  Explained that the purpose of today's ultrasound is to assess for  BPP, presentation and AFI.  Patient acknowledges the purpose of the exam and the limitations of the study.    

## 2017-06-28 NOTE — Progress Notes (Signed)
Interpreter Weir Siu present for encounter.  

## 2017-06-29 ENCOUNTER — Other Ambulatory Visit (HOSPITAL_COMMUNITY): Payer: Self-pay

## 2017-07-05 ENCOUNTER — Ambulatory Visit: Payer: Self-pay

## 2017-07-05 ENCOUNTER — Ambulatory Visit (INDEPENDENT_AMBULATORY_CARE_PROVIDER_SITE_OTHER): Payer: Medicaid Other | Admitting: *Deleted

## 2017-07-05 ENCOUNTER — Ambulatory Visit (INDEPENDENT_AMBULATORY_CARE_PROVIDER_SITE_OTHER): Payer: Medicaid Other | Admitting: Medical

## 2017-07-05 ENCOUNTER — Other Ambulatory Visit (HOSPITAL_COMMUNITY)
Admission: RE | Admit: 2017-07-05 | Discharge: 2017-07-05 | Disposition: A | Discharge status: Home / Self Care | Payer: Medicaid Other | Source: Ambulatory Visit | Attending: Medical | Admitting: Medical

## 2017-07-05 ENCOUNTER — Other Ambulatory Visit: Payer: Self-pay

## 2017-07-05 ENCOUNTER — Encounter: Payer: Self-pay | Admitting: Medical

## 2017-07-05 VITALS — BP 130/88 | HR 101 | Wt 134.3 lb

## 2017-07-05 DIAGNOSIS — O10913 Unspecified pre-existing hypertension complicating pregnancy, third trimester: Secondary | ICD-10-CM

## 2017-07-05 DIAGNOSIS — O0993 Supervision of high risk pregnancy, unspecified, third trimester: Secondary | ICD-10-CM | POA: Insufficient documentation

## 2017-07-05 DIAGNOSIS — Z789 Other specified health status: Secondary | ICD-10-CM

## 2017-07-05 DIAGNOSIS — Z3A36 36 weeks gestation of pregnancy: Secondary | ICD-10-CM | POA: Insufficient documentation

## 2017-07-05 DIAGNOSIS — O09523 Supervision of elderly multigravida, third trimester: Secondary | ICD-10-CM

## 2017-07-05 DIAGNOSIS — O10919 Unspecified pre-existing hypertension complicating pregnancy, unspecified trimester: Secondary | ICD-10-CM

## 2017-07-05 LAB — POCT URINALYSIS DIP (DEVICE)
BILIRUBIN URINE: NEGATIVE
GLUCOSE, UA: NEGATIVE mg/dL
KETONES UR: NEGATIVE mg/dL
LEUKOCYTES UA: NEGATIVE
Nitrite: NEGATIVE
PROTEIN: NEGATIVE mg/dL
Urobilinogen, UA: 0.2 mg/dL (ref 0.0–1.0)
pH: 6.5 (ref 5.0–8.0)

## 2017-07-05 NOTE — Progress Notes (Signed)
   PRENATAL VISIT NOTE  Subjective:  Renee Carson is a 37 y.o. G5P4004 at 2442w2d being seen today for ongoing prenatal care.  She is currently monitored for the following issues for this high-risk pregnancy and has Language barrier; Supervision of high risk pregnancy, antepartum, third trimester; Advanced maternal age in multigravida, second trimester; Chronic hypertension during pregnancy, antepartum; ASCUS of cervix with negative high risk HPV; and Late prenatal care on their problem list.  Patient reports no complaints.  Contractions: Irregular. Vag. Bleeding: None.  Movement: Present. Denies leaking of fluid.   The following portions of the patient's history were reviewed and updated as appropriate: allergies, current medications, past family history, past medical history, past social history, past surgical history and problem list. Problem list updated.  Objective:   Vitals:   07/05/17 1031  BP: 130/88  Pulse: (!) 101  Weight: 134 lb 4.8 oz (60.9 kg)    Fetal Status: Fetal Heart Rate (bpm): NST Fundal Height: 34 cm Movement: Present  Presentation: Vertex  General:  Alert, oriented and cooperative. Patient is in no acute distress.  Skin: Skin is warm and dry. No rash noted.   Cardiovascular: Normal heart rate noted  Respiratory: Normal respiratory effort, no problems with respiration noted  Abdomen: Soft, gravid, appropriate for gestational age.  Pain/Pressure: Present     Pelvic: Cervical exam performed Dilation: 1 Effacement (%): 50 Station: -3  Extremities: Normal range of motion.  Edema: None  Mental Status:  Normal mood and affect. Normal behavior. Normal judgment and thought content.   Assessment and Plan:  Pregnancy: G5P4004 at 4342w2d  1. Supervision of high risk pregnancy, antepartum, third trimester - GC/Chlamydia probe amp ([place])[facility] - Strep Gp B NAA  2. Chronic hypertension during pregnancy, antepartum - Normotensive today - No current  anti-hypertensive medications   3. Advanced maternal age in multigravida, third trimester  4. Language barrier - Interpreter present   Preterm labor symptoms and general obstetric precautions including but not limited to vaginal bleeding, contractions, leaking of fluid and fetal movement were reviewed in detail with the patient. Please refer to After Visit Summary for other counseling recommendations.  Return in about 8 days (around 07/13/2017) for as scheduled. [facility]. Has growth US next week already scheduled.    Vonzella NippleJulie Kerim Statzer, PA-C

## 2017-07-05 NOTE — Progress Notes (Signed)
[name] present for encounter.  

## 2017-07-05 NOTE — Progress Notes (Signed)

## 2017-07-05 NOTE — Patient Instructions (Signed)
Fetal Movement Counts Patient Name: ________________________________________________ Patient Due Date: ____________________ What is a fetal movement count? A fetal movement count is the number of times that you feel your baby move during a certain amount of time. This may also be called a fetal kick count. A fetal movement count is recommended for every pregnant woman. You may be asked to start counting fetal movements as early as week 28 of your pregnancy. Pay attention to when your baby is most active. You may notice your baby's sleep and wake cycles. You may also notice things that make your baby move more. You should do a fetal movement count:  When your baby is normally most active.  At the same time each day.  A good time to count movements is while you are resting, after having something to eat and drink. How do I count fetal movements? 1. Find a quiet, comfortable area. Sit, or lie down on your side. 2. Write down the date, the start time and stop time, and the number of movements that you felt between those two times. Take this information with you to your health care visits. 3. For 2 hours, count kicks, flutters, swishes, rolls, and jabs. You should feel at least 10 movements during 2 hours. 4. You may stop counting after you have felt 10 movements. 5. If you do not feel 10 movements in 2 hours, have something to eat and drink. Then, keep resting and counting for 1 hour. If you feel at least 4 movements during that hour, you may stop counting. Contact a health care provider if:  You feel fewer than 4 movements in 2 hours.  Your baby is not moving like he or she usually does. Date: ____________ Start time: ____________ Stop time: ____________ Movements: ____________ Date: ____________ Start time: ____________ Stop time: ____________ Movements: ____________ Date: ____________ Start time: ____________ Stop time: ____________ Movements: ____________ Date: ____________ Start time:  ____________ Stop time: ____________ Movements: ____________ Date: ____________ Start time: ____________ Stop time: ____________ Movements: ____________ Date: ____________ Start time: ____________ Stop time: ____________ Movements: ____________ Date: ____________ Start time: ____________ Stop time: ____________ Movements: ____________ Date: ____________ Start time: ____________ Stop time: ____________ Movements: ____________ Date: ____________ Start time: ____________ Stop time: ____________ Movements: ____________ This information is not intended to replace advice given to you by your health care provider. Make sure you discuss any questions you have with your health care provider. Document Released: 05/12/2006 Document Revised: 12/10/2015 Document Reviewed: 05/22/2015 Elsevier Interactive Patient Education  2018 Elsevier Inc.  Braxton Hicks Contractions Contractions of the uterus can occur throughout pregnancy, but they are not always a sign that you are in labor. You may have practice contractions called Braxton Hicks contractions. These false labor contractions are sometimes confused with true labor. What are Braxton Hicks contractions? Braxton Hicks contractions are tightening movements that occur in the muscles of the uterus before labor. Unlike true labor contractions, these contractions do not result in opening (dilation) and thinning of the cervix. Toward the end of pregnancy (32-34 weeks), Braxton Hicks contractions can happen more often and may become stronger. These contractions are sometimes difficult to tell apart from true labor because they can be very uncomfortable. You should not feel embarrassed if you go to the hospital with false labor. Sometimes, the only way to tell if you are in true labor is for your health care provider to look for changes in the cervix. The health care provider will do a physical exam and may monitor your contractions.   If you are not in true labor, the exam  should show that your cervix is not dilating and your water has not broken. If there are other health problems associated with your pregnancy, it is completely safe for you to be sent home with false labor. You may continue to have Braxton Hicks contractions until you go into true labor. How to tell the difference between true labor and false labor True labor  Contractions last 30-70 seconds.  Contractions become very regular.  Discomfort is usually felt in the top of the uterus, and it spreads to the lower abdomen and low back.  Contractions do not go away with walking.  Contractions usually become more intense and increase in frequency.  The cervix dilates and gets thinner. False labor  Contractions are usually shorter and not as strong as true labor contractions.  Contractions are usually irregular.  Contractions are often felt in the front of the lower abdomen and in the groin.  Contractions may go away when you walk around or change positions while lying down.  Contractions get weaker and are shorter-lasting as time goes on.  The cervix usually does not dilate or become thin. Follow these instructions at home:  Take over-the-counter and prescription medicines only as told by your health care provider.  Keep up with your usual exercises and follow other instructions from your health care provider.  Eat and drink lightly if you think you are going into labor.  If Braxton Hicks contractions are making you uncomfortable: ? Change your position from lying down or resting to walking, or change from walking to resting. ? Sit and rest in a tub of warm water. ? Drink enough fluid to keep your urine pale yellow. Dehydration may cause these contractions. ? Do slow and deep breathing several times an hour.  Keep all follow-up prenatal visits as told by your health care provider. This is important. Contact a health care provider if:  You have a fever.  You have continuous pain  in your abdomen. Get help right away if:  Your contractions become stronger, more regular, and closer together.  You have fluid leaking or gushing from your vagina.  You pass blood-tinged mucus (bloody show).  You have bleeding from your vagina.  You have low back pain that you never had before.  You feel your baby's head pushing down and causing pelvic pressure.  Your baby is not moving inside you as much as it used to. Summary  Contractions that occur before labor are called Braxton Hicks contractions, false labor, or practice contractions.  Braxton Hicks contractions are usually shorter, weaker, farther apart, and less regular than true labor contractions. True labor contractions usually become progressively stronger and regular and they become more frequent.  Manage discomfort from Braxton Hicks contractions by changing position, resting in a warm bath, drinking plenty of water, or practicing deep breathing. This information is not intended to replace advice given to you by your health care provider. Make sure you discuss any questions you have with your health care provider. Document Released: 08/26/2016 Document Revised: 08/26/2016 Document Reviewed: 08/26/2016 Elsevier Interactive Patient Education  2018 Elsevier Inc.   Hypertension During Pregnancy Hypertension is also called high blood pressure. High blood pressure means that the force of your blood moving in your body is too strong. When you are pregnant, this condition should be watched carefully. It can cause problems for you and your baby. Follow these instructions at home: Eating and drinking  Drink enough fluid   to keep your pee (urine) clear or pale yellow.  Eat healthy foods that are low in salt (sodium). ? Do not add salt to your food. ? Check labels on foods and drinks to see much salt is in them. Look on the label where you see "Sodium." Lifestyle  Do not use any products that contain nicotine or tobacco,  such as cigarettes and e-cigarettes. If you need help quitting, ask your doctor.  Do not use alcohol.  Avoid caffeine.  Avoid stress. Rest and get plenty of sleep. General instructions  Take over-the-counter and prescription medicines only as told by your doctor.  While lying down, lie on your left side. This keeps pressure off your baby.  While sitting or lying down, raise (elevate) your feet. Try putting some pillows under your lower legs.  Exercise regularly. Ask your doctor what kinds of exercise are best for you.  Keep all prenatal and follow-up visits as told by your doctor. This is important. Contact a doctor if:  You have symptoms that your doctor told you to watch for, such as: ? Fever. ? Throwing up (vomiting). ? Headache. Get help right away if:  You have very bad pain in your belly (abdomen).  You are throwing up, and this does not get better with treatment.  You suddenly get swelling in your hands, ankles, or face.  You gain 4 lb (1.8 kg) or more in 1 week.  You get bleeding from your vagina.  You have blood in your pee.  You do not feel your baby moving as much as normal.  You have a change in vision.  You have muscle twitching or sudden tightening (spasms).  You have trouble breathing.  Your lips or fingernails turn blue. This information is not intended to replace advice given to you by your health care provider. Make sure you discuss any questions you have with your health care provider. Document Released: 05/15/2010 Document Revised: 12/23/2015 Document Reviewed: 12/23/2015 Elsevier Interactive Patient Education  2018 Elsevier Inc.    

## 2017-07-06 ENCOUNTER — Encounter: Payer: Self-pay | Admitting: Obstetrics and Gynecology

## 2017-07-06 ENCOUNTER — Other Ambulatory Visit (HOSPITAL_COMMUNITY): Payer: Self-pay

## 2017-07-06 ENCOUNTER — Encounter: Payer: Self-pay | Admitting: Medical

## 2017-07-06 LAB — GC/CHLAMYDIA PROBE AMP (~~LOC~~) NOT AT ARMC
CHLAMYDIA, DNA PROBE: NEGATIVE
NEISSERIA GONORRHEA: NEGATIVE

## 2017-07-07 LAB — STREP GP B NAA: STREP GROUP B AG: POSITIVE — AB

## 2017-07-12 ENCOUNTER — Other Ambulatory Visit: Payer: Self-pay

## 2017-07-13 ENCOUNTER — Other Ambulatory Visit (HOSPITAL_COMMUNITY): Payer: Self-pay | Admitting: Obstetrics and Gynecology

## 2017-07-13 ENCOUNTER — Ambulatory Visit (HOSPITAL_COMMUNITY)
Admission: RE | Admit: 2017-07-13 | Discharge: 2017-07-13 | Disposition: A | Discharge status: Home / Self Care | Payer: Medicaid Other | Source: Ambulatory Visit | Attending: Obstetrics and Gynecology | Admitting: Obstetrics and Gynecology

## 2017-07-13 ENCOUNTER — Ambulatory Visit (INDEPENDENT_AMBULATORY_CARE_PROVIDER_SITE_OTHER): Payer: Medicaid Other | Admitting: General Practice

## 2017-07-13 VITALS — BP 141/93 | HR 100

## 2017-07-13 DIAGNOSIS — O10913 Unspecified pre-existing hypertension complicating pregnancy, third trimester: Secondary | ICD-10-CM | POA: Insufficient documentation

## 2017-07-13 DIAGNOSIS — O0993 Supervision of high risk pregnancy, unspecified, third trimester: Secondary | ICD-10-CM

## 2017-07-13 DIAGNOSIS — O09523 Supervision of elderly multigravida, third trimester: Secondary | ICD-10-CM

## 2017-07-13 DIAGNOSIS — O10919 Unspecified pre-existing hypertension complicating pregnancy, unspecified trimester: Secondary | ICD-10-CM

## 2017-07-13 DIAGNOSIS — O09522 Supervision of elderly multigravida, second trimester: Secondary | ICD-10-CM

## 2017-07-13 DIAGNOSIS — Z3A37 37 weeks gestation of pregnancy: Secondary | ICD-10-CM | POA: Diagnosis not present

## 2017-07-13 NOTE — Progress Notes (Signed)
Patient denies HAs, dizziness, or blurry vision 

## 2017-07-13 NOTE — Progress Notes (Signed)
NST reviewed and is reactive. BPP today also noted to be 8/8. Continue antepartum fetal surveillance

## 2017-07-19 ENCOUNTER — Ambulatory Visit (INDEPENDENT_AMBULATORY_CARE_PROVIDER_SITE_OTHER): Payer: Medicaid Other | Admitting: Family Medicine

## 2017-07-19 ENCOUNTER — Ambulatory Visit: Payer: Self-pay

## 2017-07-19 ENCOUNTER — Other Ambulatory Visit: Payer: Self-pay

## 2017-07-19 ENCOUNTER — Ambulatory Visit (INDEPENDENT_AMBULATORY_CARE_PROVIDER_SITE_OTHER): Payer: Medicaid Other | Admitting: General Practice

## 2017-07-19 VITALS — BP 127/85 | HR 95 | Wt 135.0 lb

## 2017-07-19 DIAGNOSIS — O10913 Unspecified pre-existing hypertension complicating pregnancy, third trimester: Secondary | ICD-10-CM

## 2017-07-19 DIAGNOSIS — O10919 Unspecified pre-existing hypertension complicating pregnancy, unspecified trimester: Secondary | ICD-10-CM

## 2017-07-19 DIAGNOSIS — O0993 Supervision of high risk pregnancy, unspecified, third trimester: Secondary | ICD-10-CM

## 2017-07-19 DIAGNOSIS — O99820 Streptococcus B carrier state complicating pregnancy: Secondary | ICD-10-CM

## 2017-07-19 DIAGNOSIS — O09522 Supervision of elderly multigravida, second trimester: Secondary | ICD-10-CM

## 2017-07-19 LAB — POCT URINALYSIS DIP (DEVICE)
Bilirubin Urine: NEGATIVE
GLUCOSE, UA: NEGATIVE mg/dL
KETONES UR: NEGATIVE mg/dL
Leukocytes, UA: NEGATIVE
Nitrite: NEGATIVE
PROTEIN: NEGATIVE mg/dL
Specific Gravity, Urine: <=1.005 (ref 1.005–1.030)
UROBILINOGEN UA: 0.2 mg/dL (ref 0.0–1.0)
pH: 6 (ref 5.0–8.0)

## 2017-07-19 NOTE — Progress Notes (Signed)
No interpreter was available today from agency. [employer] does not provide the language. Patient understands some english and is able to voice concerns.   IOL 4/7 @ 7am

## 2017-07-19 NOTE — Progress Notes (Signed)
   PRENATAL VISIT NOTE  Subjective:  Renee Carson is a 37 y.o. G5P4004 at 1217w2d being seen today for ongoing prenatal care.  She is currently monitored for the following issues for this high-risk pregnancy and has Language barrier; Supervision of high risk pregnancy, antepartum, third trimester; Advanced maternal age in multigravida, second trimester; Chronic hypertension during pregnancy, antepartum; ASCUS of cervix with negative high risk HPV; Late prenatal care; and Group B Streptococcus carrier, +RV culture, currently pregnant on their problem list.  Patient reports no complaints.  Contractions: Irregular. Vag. Bleeding: None.  Movement: Present. Denies leaking of fluid.   The following portions of the patient's history were reviewed and updated as appropriate: allergies, current medications, past family history, past medical history, past social history, past surgical history and problem list. Problem list updated.  Objective:   Vitals:   07/19/17 1029 07/19/17 1126  BP: (!) 149/98 127/85  Pulse: 95   Weight: 135 lb (61.2 kg)     Fetal Status: Fetal Heart Rate (bpm): NST   Movement: Present     General:  Alert, oriented and cooperative. Patient is in no acute distress.  Skin: Skin is warm and dry. No rash noted.   Cardiovascular: Normal heart rate noted  Respiratory: Normal respiratory effort, no problems with respiration noted  Abdomen: Soft, gravid, appropriate for gestational age.  Pain/Pressure: Present     Pelvic: Cervical exam deferred        Extremities: Normal range of motion.  Edema: None  Mental Status:  Normal mood and affect. Normal behavior. Normal judgment and thought content.   Assessment and Plan:  Pregnancy: G5P4004 at 8417w2d  1. Supervision of high risk pregnancy, antepartum, third trimester  2. Chronic hypertension during pregnancy, antepartum Initial BP 140/90s, repeat normal. Asymptomatic. Not on meds - PreE precautions - BPP today 10/10 - Schedule IOL  for 40 weeks (orderes placed)  3. Advanced maternal age in multigravida, second trimester Deliver by 40 weeks  4. Group B Streptococcus carrier, +RV culture, currently pregnant Will need IAP  Term labor symptoms and general obstetric precautions including but not limited to vaginal bleeding, contractions, leaking of fluid and fetal movement were reviewed in detail with the patient. Please refer to After Visit Summary for other counseling recommendations.  Return in about 1 week (around 07/26/2017).   Frederik PearJulie P Federico Maiorino, MD   Future Appointments  Date Time Provider Department Center  07/26/2017  8:15 AM [facility] NST [facility]  07/26/2017  9:15 AM Pincus LargePhelps, Jazma Y, DO [facility]  07/31/2017  7:00 AM [facility] None

## 2017-07-19 NOTE — Progress Notes (Signed)
Pt informed that the ultrasound is considered a limited OB ultrasound and is not intended to be a complete ultrasound exam.  Patient also informed that the ultrasound is not being completed with the intent of assessing for fetal or placental anomalies or any pelvic abnormalities.  Explained that the purpose of today's ultrasound is to assess for  BPP, presentation and AFI.  Patient acknowledges the purpose of the exam and the limitations of the study.    

## 2017-07-22 ENCOUNTER — Other Ambulatory Visit: Payer: Self-pay

## 2017-07-22 ENCOUNTER — Inpatient Hospital Stay (HOSPITAL_COMMUNITY): Payer: Medicaid Other | Admitting: Anesthesiology

## 2017-07-22 ENCOUNTER — Inpatient Hospital Stay (HOSPITAL_COMMUNITY)
Admission: AD | Admit: 2017-07-22 | Discharge: 2017-07-25 | DRG: 797 | Disposition: A | Discharge status: Home / Self Care | Payer: Medicaid Other | Source: Ambulatory Visit | Attending: Obstetrics and Gynecology | Admitting: Obstetrics and Gynecology

## 2017-07-22 ENCOUNTER — Encounter (HOSPITAL_COMMUNITY): Payer: Self-pay

## 2017-07-22 DIAGNOSIS — Z23 Encounter for immunization: Secondary | ICD-10-CM

## 2017-07-22 DIAGNOSIS — O0930 Supervision of pregnancy with insufficient antenatal care, unspecified trimester: Secondary | ICD-10-CM

## 2017-07-22 DIAGNOSIS — I10 Essential (primary) hypertension: Secondary | ICD-10-CM | POA: Diagnosis present

## 2017-07-22 DIAGNOSIS — O10919 Unspecified pre-existing hypertension complicating pregnancy, unspecified trimester: Secondary | ICD-10-CM | POA: Diagnosis present

## 2017-07-22 DIAGNOSIS — O99824 Streptococcus B carrier state complicating childbirth: Secondary | ICD-10-CM | POA: Diagnosis present

## 2017-07-22 DIAGNOSIS — O1002 Pre-existing essential hypertension complicating childbirth: Principal | ICD-10-CM | POA: Diagnosis present

## 2017-07-22 DIAGNOSIS — Z789 Other specified health status: Secondary | ICD-10-CM | POA: Diagnosis present

## 2017-07-22 DIAGNOSIS — Z3A38 38 weeks gestation of pregnancy: Secondary | ICD-10-CM | POA: Diagnosis not present

## 2017-07-22 DIAGNOSIS — Z758 Other problems related to medical facilities and other health care: Secondary | ICD-10-CM | POA: Diagnosis present

## 2017-07-22 DIAGNOSIS — O1092 Unspecified pre-existing hypertension complicating childbirth: Secondary | ICD-10-CM | POA: Diagnosis not present

## 2017-07-22 DIAGNOSIS — O99820 Streptococcus B carrier state complicating pregnancy: Secondary | ICD-10-CM

## 2017-07-22 DIAGNOSIS — O09522 Supervision of elderly multigravida, second trimester: Secondary | ICD-10-CM | POA: Diagnosis present

## 2017-07-22 DIAGNOSIS — O4413 Placenta previa with hemorrhage, third trimester: Secondary | ICD-10-CM | POA: Diagnosis not present

## 2017-07-22 LAB — COMPREHENSIVE METABOLIC PANEL
ALBUMIN: 3.2 g/dL — AB (ref 3.5–5.0)
ALT: 9 U/L — ABNORMAL LOW (ref 14–54)
ANION GAP: 9 (ref 5–15)
AST: 17 U/L (ref 15–41)
Alkaline Phosphatase: 268 U/L — ABNORMAL HIGH (ref 38–126)
BILIRUBIN TOTAL: 0.5 mg/dL (ref 0.3–1.2)
BUN: 8 mg/dL (ref 6–20)
CHLORIDE: 107 mmol/L (ref 101–111)
CO2: 20 mmol/L — ABNORMAL LOW (ref 22–32)
Calcium: 8.5 mg/dL — ABNORMAL LOW (ref 8.9–10.3)
Creatinine, Ser: 0.5 mg/dL (ref 0.44–1.00)
GFR calc Af Amer: >60 mL/min (ref >60)
GFR calc non Af Amer: >60 mL/min (ref >60)
GLUCOSE: 70 mg/dL (ref 65–99)
POTASSIUM: 3.8 mmol/L (ref 3.5–5.1)
Sodium: 136 mmol/L (ref 135–145)
Total Protein: 6.6 g/dL (ref 6.5–8.1)

## 2017-07-22 LAB — CBC
HCT: 33.4 % — ABNORMAL LOW (ref 36.0–46.0)
Hemoglobin: 10.8 g/dL — ABNORMAL LOW (ref 12.0–15.0)
MCH: 19.2 pg — ABNORMAL LOW (ref 26.0–34.0)
MCHC: 32.3 g/dL (ref 30.0–36.0)
MCV: 59.3 fL — ABNORMAL LOW (ref 78.0–100.0)
Platelets: 197 10*3/uL (ref 150–400)
RBC: 5.63 MIL/uL — ABNORMAL HIGH (ref 3.87–5.11)
RDW: 17.7 % — ABNORMAL HIGH (ref 11.5–15.5)
WBC: 10.4 10*3/uL (ref 4.0–10.5)

## 2017-07-22 LAB — POCT FERN TEST: POCT Fern Test: POSITIVE

## 2017-07-22 LAB — ABO/RH: ABO/RH(D): A POS

## 2017-07-22 MED ORDER — DIPHENHYDRAMINE HCL 50 MG/ML IJ SOLN: 12.5000 mg | INTRAMUSCULAR | Status: DC | PRN

## 2017-07-22 MED ORDER — LACTATED RINGERS IV SOLN: 500.0000 mL | INTRAVENOUS | Status: DC | PRN

## 2017-07-22 MED ORDER — SODIUM CHLORIDE 0.9 % IV SOLN
5.0000 10*6.[IU] | Freq: Once | INTRAVENOUS | Status: AC
  Administered 2017-07-22: 5 10*6.[IU] via INTRAVENOUS
  Filled 2017-07-22: qty 5

## 2017-07-22 MED ORDER — PHENYLEPHRINE 40 MCG/ML (10ML) SYRINGE FOR IV PUSH (FOR BLOOD PRESSURE SUPPORT): 80.0000 ug | PREFILLED_SYRINGE | INTRAVENOUS | Status: DC | PRN

## 2017-07-22 MED ORDER — FENTANYL CITRATE (PF) 100 MCG/2ML IJ SOLN
100.0000 ug | INTRAMUSCULAR | Status: DC | PRN
  Administered 2017-07-22: 100 ug via INTRAVENOUS
  Filled 2017-07-22: qty 2

## 2017-07-22 MED ORDER — LACTATED RINGERS IV SOLN
500.0000 mL | Freq: Once | INTRAVENOUS | Status: AC
  Administered 2017-07-22: 500 mL via INTRAVENOUS

## 2017-07-22 MED ORDER — EPHEDRINE 5 MG/ML INJ: 10.0000 mg | INTRAVENOUS | Status: DC | PRN

## 2017-07-22 MED ORDER — PENICILLIN G POTASSIUM 5000000 UNITS IJ SOLR: 5.0000 10*6.[IU] | Freq: Once | INTRAMUSCULAR | Status: DC

## 2017-07-22 MED ORDER — PHENYLEPHRINE 40 MCG/ML (10ML) SYRINGE FOR IV PUSH (FOR BLOOD PRESSURE SUPPORT)
80.0000 ug | PREFILLED_SYRINGE | INTRAVENOUS | Status: DC | PRN
  Filled 2017-07-22: qty 10

## 2017-07-22 MED ORDER — ONDANSETRON HCL 4 MG/2ML IJ SOLN: 4.0000 mg | Freq: Four times a day (QID) | INTRAMUSCULAR | Status: DC | PRN

## 2017-07-22 MED ORDER — ACETAMINOPHEN 325 MG PO TABS
650.0000 mg | ORAL_TABLET | ORAL | Status: DC | PRN
  Filled 2017-07-22 (×2): qty 2

## 2017-07-22 MED ORDER — PENICILLIN G POT IN DEXTROSE 60000 UNIT/ML IV SOLN: 3.0000 10*6.[IU] | INTRAVENOUS | Status: DC

## 2017-07-22 MED ORDER — LACTATED RINGERS IV SOLN
INTRAVENOUS | Status: DC
  Administered 2017-07-22: 15:00:00 via INTRAVENOUS

## 2017-07-22 MED ORDER — OXYTOCIN 40 UNITS IN LACTATED RINGERS INFUSION - SIMPLE MED
1.0000 m[IU]/min | INTRAVENOUS | Status: DC
  Administered 2017-07-22: 2 m[IU]/min via INTRAVENOUS

## 2017-07-22 MED ORDER — FENTANYL 2.5 MCG/ML BUPIVACAINE 1/10 % EPIDURAL INFUSION (WH - ANES)
14.0000 mL/h | INTRAMUSCULAR | Status: DC | PRN
  Filled 2017-07-22: qty 100

## 2017-07-22 MED ORDER — MISOPROSTOL 25 MCG QUARTER TABLET: 25.0000 ug | ORAL_TABLET | ORAL | Status: DC | PRN

## 2017-07-22 MED ORDER — OXYTOCIN 40 UNITS IN LACTATED RINGERS INFUSION - SIMPLE MED
2.5000 [IU]/h | INTRAVENOUS | Status: DC
  Filled 2017-07-22 (×2): qty 1000

## 2017-07-22 MED ORDER — OXYTOCIN BOLUS FROM INFUSION
500.0000 mL | Freq: Once | INTRAVENOUS | Status: AC
  Administered 2017-07-23: 500 mL via INTRAVENOUS

## 2017-07-22 MED ORDER — LACTATED RINGERS IV SOLN: INTRAVENOUS | Status: DC

## 2017-07-22 MED ORDER — TERBUTALINE SULFATE 1 MG/ML IJ SOLN: 0.2500 mg | Freq: Once | INTRAMUSCULAR | Status: DC | PRN

## 2017-07-22 MED ORDER — OXYCODONE-ACETAMINOPHEN 5-325 MG PO TABS: 1.0000 | ORAL_TABLET | ORAL | Status: DC | PRN

## 2017-07-22 MED ORDER — OXYCODONE-ACETAMINOPHEN 5-325 MG PO TABS: 2.0000 | ORAL_TABLET | ORAL | Status: DC | PRN

## 2017-07-22 MED ORDER — LIDOCAINE HCL (PF) 1 % IJ SOLN
INTRAMUSCULAR | Status: DC | PRN
  Administered 2017-07-22 (×2): 5 mL via EPIDURAL

## 2017-07-22 MED ORDER — SOD CITRATE-CITRIC ACID 500-334 MG/5ML PO SOLN: 30.0000 mL | ORAL | Status: DC | PRN

## 2017-07-22 MED ORDER — FLEET ENEMA 7-19 GM/118ML RE ENEM: 1.0000 | ENEMA | RECTAL | Status: DC | PRN

## 2017-07-22 MED ORDER — PENICILLIN G POT IN DEXTROSE 60000 UNIT/ML IV SOLN
3.0000 10*6.[IU] | INTRAVENOUS | Status: DC
  Administered 2017-07-22 (×2): 3 10*6.[IU] via INTRAVENOUS
  Filled 2017-07-22 (×5): qty 50

## 2017-07-22 MED ORDER — OXYTOCIN BOLUS FROM INFUSION: 500.0000 mL | Freq: Once | INTRAVENOUS | Status: DC

## 2017-07-22 MED ORDER — OXYTOCIN 40 UNITS IN LACTATED RINGERS INFUSION - SIMPLE MED: 2.5000 [IU]/h | INTRAVENOUS | Status: DC

## 2017-07-22 MED ORDER — ACETAMINOPHEN 325 MG PO TABS: 650.0000 mg | ORAL_TABLET | ORAL | Status: DC | PRN

## 2017-07-22 MED ORDER — LIDOCAINE HCL (PF) 1 % IJ SOLN: 30.0000 mL | INTRAMUSCULAR | Status: DC | PRN

## 2017-07-22 MED ORDER — LIDOCAINE HCL (PF) 1 % IJ SOLN
30.0000 mL | INTRAMUSCULAR | Status: DC | PRN
  Filled 2017-07-22: qty 30

## 2017-07-22 NOTE — Progress Notes (Addendum)
Labor Progress Note Renee Carson is a 37 y.o. G5P4004 at 1772w5d presented for labor  History obtained using patient's daughter as Nurse, learning disabilitytranslator, patient speaks [place]an[place] SpireJarai  S: Patient resting, has no complaints of headache, visual changes, nausea, vomiting, or any other sxs.   O:  BP (!) 137/94   Pulse (!) 103   Temp 98.1 F (36.7 C) (Oral)   Resp 20   Ht 5\' 3"  (1.6 m)   Wt 61.7 kg (136 lb)   LMP 10/24/2016 (Approximate)   SpO2 98%   BMI 24.09 kg/m   ZOX:WRUEAVWUFHT:baseline rate 130, moderate variability, + acels, some early decels Toco: ctx every 2-3 min  CVE: Dilation: 9 Effacement (%): 100 Cervical Position: Middle Station: 0 Presentation: Vertex Exam by:: Dennis BastVeronica Mensah  A&P: 37 y.o. [id number] 6472w5d here for labor.  #Labor: Pitocin was started at 2153, progressing well.  #cHTN: No symptoms of pre-eclampsia. Continue to monitor.  #Pain: epidural #FWB: Category I #GBS positive - PCN  Felicie MornHannah E Smith, Medical Student 10:55 PM   I personally reviewed the patient's strip and agree with the documentation as presented above. Additionally, BP 140s systolically, none in severe range, plan to continue to monitor.  Ellwood DenseAlison Teancum Brule, DO PGY-1, [facility] 07/22/2017 11:19 PM

## 2017-07-22 NOTE — Progress Notes (Signed)
Adeola H Mounsey is a 37 y.o. female presenting for labor. High risk OB for chronic hypertension during pregnancy, AMA, late prenatal care and GBS+.     Subjective: Feeling pressure. Has urge to push.   Objective: BP 136/90   Pulse 97   Temp 98.4 F (36.9 C) (Oral)   Resp 20   Ht 5\' 3"  (1.6 m)   Wt 61.7 kg (136 lb)   LMP 10/24/2016 (Approximate)   BMI 24.09 kg/m  No intake/output data recorded. No intake/output data recorded.  FHT:  FHR: 140 bpm, variability: moderate,  accelerations:  Present,  decelerations:  Absent UC:   regular, every 2 minutes SVE:   Dilation: 4 Effacement (%): 60 Station: -2 Exam by:: Brunswick CorporationHinds  Labs: Lab Results  Component Value Date   WBC 10.4 07/22/2017   HGB 10.8 (L) 07/22/2017   HCT 33.4 (L) 07/22/2017   MCV 59.3 (L) 07/22/2017   PLT 197 07/22/2017    Assessment / Plan: 37 yo G5P4 w/chronic hypertension without medications, GBS+ who presents for labor. S/p AROM 18:15. 4/60/-2.    Labor: Progressing normally Preeclampsia: N/A Fetal Wellbeing:  Category I Pain Control:  Labor support without medications I/D:  n/a Anticipated MOD:  NSVD  Avalynne Diver A Che Rachal 07/22/2017, 6:16 PM

## 2017-07-22 NOTE — Anesthesia Procedure Notes (Signed)
Epidural Patient location during procedure: OB  Staffing Anesthesiologist: Bethena Midgetddono, Inas Avena, MD  Preanesthetic Checklist Completed: patient identified, site marked, surgical consent, pre-op evaluation, timeout performed, IV checked, risks and benefits discussed and monitors and equipment checked  Epidural Patient position: sitting Prep: site prepped and draped and DuraPrep Patient monitoring: continuous pulse ox and blood pressure Approach: midline Location: L3-L4 Injection technique: LOR air  Needle:  Needle type: Tuohy  Needle gauge: 17 G Needle length: 9 cm and 9 Needle insertion depth: 5 cm Catheter type: closed end flexible Catheter size: 19 Gauge Catheter at skin depth: 11 cm Test dose: negative  Assessment Events: blood not aspirated, injection not painful, no injection resistance, negative IV test and no paresthesia

## 2017-07-22 NOTE — MAU Note (Signed)
Pt presents to MAU with c/o of ctx that started around 0900. PT has had mucous discharge that she noticed at 0800 today. +FM

## 2017-07-22 NOTE — Anesthesia Pain Management Evaluation Note (Signed)
  CRNA Pain Management Visit Note  Patient: Renee Carson, 37 y.o., female  "Hello I am a member of the anesthesia team at [facility]. We have an anesthesia team available at all times to provide care throughout the hospital, including epidural management and anesthesia for C-section. I don't know your plan for the delivery whether it a natural birth, water birth, IV sedation, nitrous supplementation, doula or epidural, but we want to meet your pain goals."   1.Was your pain managed to your expectations on prior hospitalizations?   Yes   2.What is your expectation for pain management during this hospitalization?     Labor support without medications  3.How can we help you reach that goal? unsure  Record the patient's initial score and the patient's pain goal.   Pain: 3  Pain Goal: 10 [facility] wants you to be able to say your pain was always managed very well.  Language barrier, report from nurse  [facility] Hospital[facility]GER,Maddyn Lieurance 07/22/2017

## 2017-07-22 NOTE — H&P (Addendum)
Renee Carson is a 37 y.o. female presenting for labor. High risk OB for chronic hypertension during pregnancy, AMA, late prenatal care and GBS+.   Please note: unable to obtain a [place] interpretor through [employer], daughter at the bedside is english speaking and provided interpretation for this H&P. Will plan to call for bedside interpretor.   OB History    Gravida  5   Para  4   Term  4   Preterm      AB      Living  4     SAB      TAB      Ectopic      Multiple      Live Births  4          Past Medical History:  Diagnosis Date  . Hypertension    resolved  . Language barrier 06/02/2011   Past Surgical History:  Procedure Laterality Date  . NO PAST SURGERIES     Family History: family history is not on file. Social History:  reports that she has never smoked. She has never used smokeless tobacco. She reports that she does not drink alcohol or use drugs.     Maternal Diabetes: No Genetic Screening: Declined Maternal Ultrasounds/Referrals: Normal Fetal Ultrasounds or other Referrals:  None Maternal Substance Abuse:  No Significant Maternal Medications:  None Significant Maternal Lab Results:  Lab values include: Group B Strep positive Other Comments:  None  Review of Systems  Constitutional: Negative for fever.  HENT: Negative for ear pain.   Eyes: Negative for blurred vision, double vision and photophobia.  Respiratory: Negative for shortness of breath.   Cardiovascular: Negative for chest pain.  Gastrointestinal: Negative for nausea and vomiting.  Genitourinary: Negative for dysuria.  Neurological: Negative for dizziness and headaches.  Psychiatric/Behavioral: Negative for depression and suicidal ideas.   Maternal Medical History:  Reason for admission: Rupture of membranes and contractions.  Nausea.  Contractions: Onset was 6-12 hours ago.   Frequency: regular.   Duration is approximately 5 minutes.   Perceived severity is strong.     Fetal activity: Perceived fetal activity is normal.   Last perceived fetal movement was within the past hour.    Prenatal Complications - Diabetes: none.    Dilation: 4 Effacement (%): 80, 90 Station: -3 Exam by:: B. Bowen, RN Blood pressure (!) 133/95, pulse (!) 108, temperature 98.3 F (36.8 C), temperature source Oral, resp. rate 18, height 5\' 3"  (1.6 m), weight 61.7 kg (136 lb), last menstrual period 10/24/2016. Maternal Exam:  Uterine Assessment: Contraction strength is firm.  Contraction duration is 3 minutes. Contraction frequency is regular.   Abdomen: Estimated fetal weight is 6 lbs.       Physical Exam  Constitutional: She is oriented to person, place, and time. She appears well-developed and well-nourished. No distress.  HENT:  Head: Normocephalic and atraumatic.  Eyes: Conjunctivae and EOM are normal. Right eye exhibits no discharge. Left eye exhibits no discharge. No scleral icterus.  Cardiovascular: Normal rate, regular rhythm, normal heart sounds and intact distal pulses. Exam reveals no gallop and no friction rub.  No murmur heard. Respiratory: Effort normal and breath sounds normal. No respiratory distress. She has no wheezes.  GI: Soft. Bowel sounds are normal.  Musculoskeletal: She exhibits no edema.  Neurological: She is alert and oriented to person, place, and time. No cranial nerve deficit.  Skin: Skin is warm and dry. She is not diaphoretic.  Psychiatric: She has  a normal mood and affect. Her behavior is normal. Judgment and thought content normal.    Prenatal labs: ABO, Rh: A/Positive/-- (12/28 1031) Antibody: Comment, See Final Results (12/28 1031) Rubella: 1.96 (12/28 1031) RPR: Non Reactive (01/17 0905)  HBsAg: Negative (12/28 1031)  HIV: Non Reactive (01/17 0905)  GBS: Positive (03/12 1132)   Assessment/Plan: 37 yo G5P4 w/chronic hypertension without medications, GBS+ who presents for labor. Denies s/s of preeclampsia.  #Chronic  Hypertension - continue to monitor blood pressure - continue to evaluate for signs/symptoms of preE - follow CMP  #GBS+ - PCN ordered  # Labor - 4/80/-3 @ 14:15 per MAU - plan for expectant management, will consider augmentation if indicated   Enos FlingKumba A Hinds 07/22/2017, 3:27 PM  I have seen the patient with the resident, and I agree with the note.  Thressa ShellerHeather Taran Hable 5:58 PM 07/22/17

## 2017-07-22 NOTE — Anesthesia Preprocedure Evaluation (Signed)
Anesthesia Evaluation  Patient identified by MRN, date of birth, ID band Patient awake    Reviewed: Allergy & Precautions, H&P , NPO status , Patient's Chart, lab work & pertinent test results, reviewed documented beta blocker date and time   Airway Mallampati: II  TM Distance: >3 FB Neck ROM: full    Dental no notable dental hx.    Pulmonary neg pulmonary ROS,    Pulmonary exam normal breath sounds clear to auscultation       Cardiovascular hypertension, negative cardio ROS Normal cardiovascular exam Rhythm:regular Rate:Normal     Neuro/Psych negative neurological ROS  negative psych ROS   GI/Hepatic negative GI ROS, Neg liver ROS,   Endo/Other  negative endocrine ROS  Renal/GU negative Renal ROS  negative genitourinary   Musculoskeletal   Abdominal   Peds  Hematology negative hematology ROS (+)   Anesthesia Other Findings   Reproductive/Obstetrics (+) Pregnancy                             Anesthesia Physical Anesthesia Plan  ASA: II  Anesthesia Plan: Epidural   Post-op Pain Management:    Induction:   PONV Risk Score and Plan:   Airway Management Planned:   Additional Equipment:   Intra-op Plan:   Post-operative Plan:   Informed Consent: I have reviewed the patients History and Physical, chart, labs and discussed the procedure including the risks, benefits and alternatives for the proposed anesthesia with the patient or authorized representative who has indicated his/her understanding and acceptance.       Plan Discussed with:   Anesthesia Plan Comments:         Anesthesia Quick Evaluation  

## 2017-07-23 ENCOUNTER — Encounter (HOSPITAL_COMMUNITY): Payer: Self-pay | Admitting: General Practice

## 2017-07-23 DIAGNOSIS — O99824 Streptococcus B carrier state complicating childbirth: Secondary | ICD-10-CM

## 2017-07-23 DIAGNOSIS — O1092 Unspecified pre-existing hypertension complicating childbirth: Secondary | ICD-10-CM

## 2017-07-23 DIAGNOSIS — Z3A38 38 weeks gestation of pregnancy: Secondary | ICD-10-CM

## 2017-07-23 DIAGNOSIS — O4413 Placenta previa with hemorrhage, third trimester: Secondary | ICD-10-CM

## 2017-07-23 LAB — CBC
HCT: 30.1 % — ABNORMAL LOW (ref 36.0–46.0)
HEMATOCRIT: 30.4 % — AB (ref 36.0–46.0)
HEMOGLOBIN: 9.8 g/dL — AB (ref 12.0–15.0)
Hemoglobin: 10.3 g/dL — ABNORMAL LOW (ref 12.0–15.0)
MCH: 19.4 pg — AB (ref 26.0–34.0)
MCH: 21.9 pg — ABNORMAL LOW (ref 26.0–34.0)
MCHC: 32.6 g/dL (ref 30.0–36.0)
MCHC: 33.9 g/dL (ref 30.0–36.0)
MCV: 59.5 fL — ABNORMAL LOW (ref 78.0–100.0)
MCV: 64.7 fL — ABNORMAL LOW (ref 78.0–100.0)
Platelets: 219 10*3/uL (ref 150–400)
Platelets: 156 10*3/uL (ref 150–400)
RBC: 5.06 MIL/uL (ref 3.87–5.11)
RBC: 4.71 MIL/uL (ref 3.87–5.11)
RDW: 17.7 % — ABNORMAL HIGH (ref 11.5–15.5)
RDW: 22.9 % — ABNORMAL HIGH (ref 11.5–15.5)
WBC: 17.6 10*3/uL — ABNORMAL HIGH (ref 4.0–10.5)
WBC: 21.3 10*3/uL — AB (ref 4.0–10.5)

## 2017-07-23 LAB — POSTPARTUM HEMORRHAGE PROTOCOL (BB NOTIFICATION)

## 2017-07-23 LAB — APTT: aPTT: 29 seconds (ref 24–36)

## 2017-07-23 LAB — PROTIME-INR
INR: 1.1
Prothrombin Time: 14.2 seconds (ref 11.4–15.2)

## 2017-07-23 LAB — PREPARE RBC (CROSSMATCH)

## 2017-07-23 LAB — FIBRINOGEN: Fibrinogen: 425 mg/dL (ref 210–475)

## 2017-07-23 LAB — SAVE SMEAR

## 2017-07-23 LAB — RPR: RPR Ser Ql: NONREACTIVE

## 2017-07-23 MED ORDER — ZOLPIDEM TARTRATE 5 MG PO TABS: 5.0000 mg | ORAL_TABLET | Freq: Every evening | ORAL | Status: DC | PRN

## 2017-07-23 MED ORDER — DIPHENHYDRAMINE HCL 25 MG PO CAPS
25.0000 mg | ORAL_CAPSULE | Freq: Four times a day (QID) | ORAL | Status: DC | PRN
  Administered 2017-07-24: 25 mg via ORAL
  Filled 2017-07-23: qty 1

## 2017-07-23 MED ORDER — ACETAMINOPHEN 325 MG PO TABS
650.0000 mg | ORAL_TABLET | ORAL | Status: DC | PRN
  Administered 2017-07-23: 650 mg via ORAL

## 2017-07-23 MED ORDER — PRENATAL MULTIVITAMIN CH
1.0000 | ORAL_TABLET | Freq: Every day | ORAL | Status: DC
  Administered 2017-07-23 – 2017-07-24 (×2): 1 via ORAL
  Filled 2017-07-23 (×2): qty 1

## 2017-07-23 MED ORDER — CARBOPROST TROMETHAMINE 250 MCG/ML IM SOLN
250.0000 ug | Freq: Once | INTRAMUSCULAR | Status: AC
  Administered 2017-07-23: 125 ug via INTRAMUSCULAR

## 2017-07-23 MED ORDER — SODIUM CHLORIDE 0.9 % IV SOLN: Freq: Once | INTRAVENOUS | Status: DC

## 2017-07-23 MED ORDER — DIBUCAINE 1 % RE OINT
1.0000 "application " | TOPICAL_OINTMENT | RECTAL | Status: DC | PRN
  Filled 2017-07-23: qty 28

## 2017-07-23 MED ORDER — ONDANSETRON HCL 4 MG PO TABS: 4.0000 mg | ORAL_TABLET | ORAL | Status: DC | PRN

## 2017-07-23 MED ORDER — TRANEXAMIC ACID 1000 MG/10ML IV SOLN
1000.0000 mg | Freq: Once | INTRAVENOUS | Status: AC
  Administered 2017-07-23: 1000 mg via INTRAVENOUS
  Filled 2017-07-23: qty 1100

## 2017-07-23 MED ORDER — CEFAZOLIN SODIUM-DEXTROSE 2-4 GM/100ML-% IV SOLN
2.0000 g | Freq: Once | INTRAVENOUS | Status: AC
  Administered 2017-07-23: 2 g via INTRAVENOUS
  Filled 2017-07-23: qty 100

## 2017-07-23 MED ORDER — MISOPROSTOL 200 MCG PO TABS
400.0000 ug | ORAL_TABLET | Freq: Once | ORAL | Status: AC
  Administered 2017-07-23: 400 ug via BUCCAL

## 2017-07-23 MED ORDER — SIMETHICONE 80 MG PO CHEW: 80.0000 mg | CHEWABLE_TABLET | ORAL | Status: DC | PRN

## 2017-07-23 MED ORDER — WITCH HAZEL-GLYCERIN EX PADS: 1.0000 "application " | MEDICATED_PAD | CUTANEOUS | Status: DC | PRN

## 2017-07-23 MED ORDER — IBUPROFEN 600 MG PO TABS
600.0000 mg | ORAL_TABLET | Freq: Four times a day (QID) | ORAL | Status: DC
  Administered 2017-07-23 – 2017-07-25 (×9): 600 mg via ORAL
  Filled 2017-07-23 (×9): qty 1

## 2017-07-23 MED ORDER — ONDANSETRON HCL 4 MG/2ML IJ SOLN: 4.0000 mg | INTRAMUSCULAR | Status: DC | PRN

## 2017-07-23 MED ORDER — MISOPROSTOL 200 MCG PO TABS
600.0000 ug | ORAL_TABLET | Freq: Once | ORAL | Status: AC
  Administered 2017-07-23: 600 ug via RECTAL

## 2017-07-23 MED ORDER — BENZOCAINE-MENTHOL 20-0.5 % EX AERO
1.0000 "application " | INHALATION_SPRAY | CUTANEOUS | Status: DC | PRN
  Administered 2017-07-23: 1 via TOPICAL
  Filled 2017-07-23 (×2): qty 56

## 2017-07-23 MED ORDER — TETANUS-DIPHTH-ACELL PERTUSSIS 5-2.5-18.5 LF-MCG/0.5 IM SUSP: 0.5000 mL | Freq: Once | INTRAMUSCULAR | Status: DC

## 2017-07-23 MED ORDER — CARBOPROST TROMETHAMINE 250 MCG/ML IM SOLN
INTRAMUSCULAR | Status: AC
  Filled 2017-07-23: qty 1

## 2017-07-23 MED ORDER — SENNOSIDES-DOCUSATE SODIUM 8.6-50 MG PO TABS
2.0000 | ORAL_TABLET | ORAL | Status: DC
  Administered 2017-07-24 (×2): 2 via ORAL
  Filled 2017-07-23 (×2): qty 2

## 2017-07-23 MED ORDER — COCONUT OIL OIL
1.0000 "application " | TOPICAL_OIL | Status: DC | PRN
  Filled 2017-07-23: qty 120

## 2017-07-23 MED ORDER — DIPHENOXYLATE-ATROPINE 2.5-0.025 MG PO TABS
2.0000 | ORAL_TABLET | Freq: Once | ORAL | Status: AC
  Administered 2017-07-23: 2 via ORAL
  Filled 2017-07-23: qty 2

## 2017-07-23 MED ORDER — MISOPROSTOL 200 MCG PO TABS
ORAL_TABLET | ORAL | Status: AC
  Filled 2017-07-23: qty 5

## 2017-07-23 NOTE — Anesthesia Postprocedure Evaluation (Signed)
Anesthesia Post Note  Patient: Renee Carson  Procedure(s) Performed: AN AD HOC LABOR EPIDURAL     Patient location during evaluation: Mother Baby Anesthesia Type: Epidural Level of consciousness: awake and alert, oriented and patient cooperative Pain management: pain level controlled Vital Signs Assessment: post-procedure vital signs reviewed and stable Respiratory status: spontaneous breathing Cardiovascular status: stable Postop Assessment: no headache, epidural receding, patient able to bend at knees and no signs of nausea or vomiting Anesthetic complications: no Comments: Rn at bedside.  RN reported she used person present with patient as interpreter. Pt interviewed via interpreter.  Pt bends knees 90 degrees.  Denies numbness/tingling.  Reports 0 pain score.     Last Vitals:  Vitals:   07/23/17 0711 07/23/17 0715  BP: 119/82 121/80  Pulse: 91 92  Resp: 18 18  Temp: 36.6 C 36.7 C  SpO2: 98% 99%    Last Pain:  Vitals:   07/23/17 0715  TempSrc: Oral  PainSc:    Pain Goal: Patients Stated Pain Goal: 3 (07/22/17 1930)               Merrilyn PumaWRINKLE,[place]

## 2017-07-24 LAB — CBC
HCT: 26.6 % — ABNORMAL LOW (ref 36.0–46.0)
HEMOGLOBIN: 9 g/dL — AB (ref 12.0–15.0)
MCH: 21.7 pg — AB (ref 26.0–34.0)
MCHC: 33.8 g/dL (ref 30.0–36.0)
MCV: 64.1 fL — ABNORMAL LOW (ref 78.0–100.0)
Platelets: 141 10*3/uL — ABNORMAL LOW (ref 150–400)
RBC: 4.15 MIL/uL (ref 3.87–5.11)
RDW: 22.6 % — ABNORMAL HIGH (ref 11.5–15.5)
WBC: 15 10*3/uL — ABNORMAL HIGH (ref 4.0–10.5)

## 2017-07-24 MED ORDER — IBUPROFEN 600 MG PO TABS: 600.0000 mg | ORAL_TABLET | Freq: Four times a day (QID) | ORAL | 0 refills | Status: DC

## 2017-07-24 MED ORDER — INFLUENZA VAC SPLIT QUAD 0.5 ML IM SUSY
0.5000 mL | PREFILLED_SYRINGE | INTRAMUSCULAR | Status: AC
  Administered 2017-07-24: 0.5 mL via INTRAMUSCULAR

## 2017-07-24 NOTE — Discharge Instructions (Signed)
Vaginal Delivery, Care After °Refer to this sheet in the next few weeks. These instructions provide you with information about caring for yourself after vaginal delivery. Your health care provider may also give you more specific instructions. Your treatment has been planned according to current medical practices, but problems sometimes occur. Call your health care provider if you have any problems or questions. °What can I expect after the procedure? °After vaginal delivery, it is common to have: °· Some bleeding from your vagina. °· Soreness in your abdomen, your vagina, and the area of skin between your vaginal opening and your anus (perineum). °· Pelvic cramps. °· Fatigue. ° °Follow these instructions at home: °Medicines °· Take over-the-counter and prescription medicines only as told by your health care provider. °· If you were prescribed an antibiotic medicine, take it as told by your health care provider. Do not stop taking the antibiotic until it is finished. °Driving ° °· Do not drive or operate heavy machinery while taking prescription pain medicine. °· Do not drive for 24 hours if you received a sedative. °Lifestyle °· Do not drink alcohol. This is especially important if you are breastfeeding or taking medicine to relieve pain. °· Do not use tobacco products, including cigarettes, chewing tobacco, or e-cigarettes. If you need help quitting, ask your health care provider. °Eating and drinking °· Drink at least 8 eight-ounce glasses of water every day unless you are told not to by your health care provider. If you choose to breastfeed your baby, you may need to drink more water than this. °· Eat high-fiber foods every day. These foods may help prevent or relieve constipation. High-fiber foods include: °? Whole grain cereals and breads. °? Brown rice. °? Beans. °? Fresh fruits and vegetables. °Activity °· Return to your normal activities as told by your health care provider. Ask your health care provider  what activities are safe for you. °· Rest as much as possible. Try to rest or take a nap when your baby is sleeping. °· Do not lift anything that is heavier than your baby or 10 lb (4.5 kg) until your health care provider says that it is safe. °· Talk with your health care provider about when you can engage in sexual activity. This may depend on your: °? Risk of infection. °? Rate of healing. °? Comfort and desire to engage in sexual activity. °Vaginal Care °· If you have an episiotomy or a vaginal tear, check the area every day for signs of infection. Check for: °? More redness, swelling, or pain. °? More fluid or blood. °? Warmth. °? Pus or a bad smell. °· Do not use tampons or douches until your health care provider says this is safe. °· Watch for any blood clots that may pass from your vagina. These may look like clumps of dark red, brown, or black discharge. °General instructions °· Keep your perineum clean and dry as told by your health care provider. °· Wear loose, comfortable clothing. °· Wipe from front to back when you use the toilet. °· Ask your health care provider if you can shower or take a bath. If you had an episiotomy or a perineal tear during labor and delivery, your health care provider may tell you not to take baths for a certain length of time. °· Wear a bra that supports your breasts and fits you well. °· If possible, have someone help you with household activities and help care for your baby for at least a few days after   you leave the hospital. °· Keep all follow-up visits for you and your baby as told by your health care provider. This is important. °Contact a health care provider if: °· You have: °? Vaginal discharge that has a bad smell. °? Difficulty urinating. °? Pain when urinating. °? A sudden increase or decrease in the frequency of your bowel movements. °? More redness, swelling, or pain around your episiotomy or vaginal tear. °? More fluid or blood coming from your episiotomy or  vaginal tear. °? Pus or a bad smell coming from your episiotomy or vaginal tear. °? A fever. °? A rash. °? Little or no interest in activities you used to enjoy. °? Questions about caring for yourself or your baby. °· Your episiotomy or vaginal tear feels warm to the touch. °· Your episiotomy or vaginal tear is separating or does not appear to be healing. °· Your breasts are painful, hard, or turn red. °· You feel unusually sad or worried. °· You feel nauseous or you vomit. °· You pass large blood clots from your vagina. If you pass a blood clot from your vagina, save it to show to your health care provider. Do not flush blood clots down the toilet without having your health care provider look at them. °· You urinate more than usual. °· You are dizzy or light-headed. °· You have not breastfed at all and you have not had a menstrual period for 12 weeks after delivery. °· You have stopped breastfeeding and you have not had a menstrual period for 12 weeks after you stopped breastfeeding. °Get help right away if: °· You have: °? Pain that does not go away or does not get better with medicine. °? Chest pain. °? Difficulty breathing. °? Blurred vision or spots in your vision. °? Thoughts about hurting yourself or your baby. °· You develop pain in your abdomen or in one of your legs. °· You develop a severe headache. °· You faint. °· You bleed from your vagina so much that you fill two sanitary pads in one hour. °This information is not intended to replace advice given to you by your health care provider. Make sure you discuss any questions you have with your health care provider. °Document Released: 04/09/2000 Document Revised: 09/24/2015 Document Reviewed: 04/27/2015 °Elsevier Interactive Patient Education © 2018 Else[employer] Inc. ° °

## 2017-07-24 NOTE — Lactation Note (Signed)
This note was copied from a baby's chart. Lactation Consultation Note  Patient Name: Boy Lillia Corporaloan Kollmann Today's Date: 07/24/2017 Reason for consult: Initial assessment;Early term 37-38.6wks  Called [employer] for Elissa LovettMontagnard Jarai interpreter services but there wasn't any available at the moment. Patient was in company of her adult daughter "Margaretmary Dysuh" and asked if her daughter could interpret since she's fully bilingual in [place]. Tuh voiced she's been interpreting for her mother while in the hospital because the language she speaks is very rare and there's usually no interpreters available and she doesn't want her to wait again. LC agreed to document such details of the encounter in her notes.  8035 hours old female who is being partially BF and formula fed by his mother, that was her feeding choice upon admission. She's a P5 and experienced BF, she BF her other kids for 12 months and tandem BF in her last pregnancy.   Per mom feedings at the breast are comfortable, both nipples looked intact upon examination. Taught mom how to hand express and was able to get some drops of colostrum to finger feed baby, she had the idea she needed to supplement with formula because she didn't have "enough milk" yet like when she was a younger BF mom.  Offered latch assistance. Mom latched baby STS on cradle position on left breast and baby would not latch, he kept crying. Expressed some colostrum and did some suck training with baby, he would not suck at the beginning but after he tried a few drops of colostrum he did. Hand expressed colostrum on nipple and latched baby on right breast, this time baby latch on, LC corrected the shallow latch; mom left some space between her breast and baby's nose so he could "breathe" explained to mom that baby's breathe through the sides of the nose and no space is needed, discussed the importance of a deep and wide latch. Baby was still nursing when exiting the room.  Encouraged mom to feed baby  on cues STS 8-12 times in 24 hours and to priorize BF over formula feeding. Reviewed BF brochure, BF resources and feeding diary. Mom is aware of LC services and will call PRN.  Maternal Data Formula Feeding for Exclusion: No Has patient been taught Hand Expression?: Yes Does the patient have breastfeeding experience prior to this delivery?: Yes  Feeding Feeding Type: Breast Fed Length of feed: 10 min(baby still nursing when exiting the room)  LATCH Score Latch: Repeated attempts needed to sustain latch, nipple held in mouth throughout feeding, stimulation needed to elicit sucking reflex.  Audible Swallowing: A few with stimulation  Type of Nipple: Everted at rest and after stimulation  Comfort (Breast/Nipple): Soft / non-tender  Hold (Positioning): Assistance needed to correctly position infant at breast and maintain latch.  LATCH Score: 7  Interventions Interventions: Breast feeding basics reviewed;Assisted with latch;Skin to skin;Breast massage;Hand express;Breast compression;Adjust position;Support pillows;Position options  Lactation Tools Discussed/Used WIC Program: Yes   Consult Status Consult Status: Follow-up Date: 07/25/17 Follow-up type: In-patient    Keela Rubert Venetia ConstableS Saud Bail 07/24/2017, 12:50 PM

## 2017-07-24 NOTE — Discharge Summary (Addendum)
OB Discharge Summary    Patient Name: Renee Carson DOB: 12/08/1980 MRN: [id number]  Date of admission: 07/22/2017 Delivering MD: Pincus LargePHELPS, JAZMA Y   Date of discharge: 07/24/2017  Admitting diagnosis: LABOR Intrauterine pregnancy: 3163w6d     Secondary diagnosis:  Principal Problem:   SVD (spontaneous vaginal delivery) Active Problems:   Language barrier   Advanced maternal age in multigravida, second trimester   Chronic hypertension during pregnancy, antepartum   Late prenatal care   Group B Streptococcus carrier, +RV culture, currently pregnant   Chronic hypertension during pregnancy   Indication for care in labor or delivery   Postpartum hemorrhage  Additional problems:      Discharge diagnosis: Term Pregnancy Delivered, CHTN and PPH                                                                                                Post partum procedures:blood transfusion, curettage  Augmentation: Pitocin  Complications: Hemorrhage>108600mL  Hospital course:  Onset of Labor With Vaginal Delivery     37 y.o. yo G5P5005 at 6863w6d was admitted in Active Labor on 07/22/2017. Patient had a labor course as follows:  Membrane Rupture Time/Date: 2:52 PM ,07/22/2017   Intrapartum Procedures: Episiotomy: None [1]                                         Lacerations:  None [1]  Patient had a delivery of a Viable infant. Patient had a retained placenta that was manually extracted, examined to be intact but with large clots with removal and signs of placental ecchymosis concerning for abruption. Patient then underwent uterine curettage under US guidance with clots removed but no missing cotyledons or retained tissue. Patient continued to have heavy bleeding with boggy uterus, was given cytotec, hemabate, and TXA. Follow up CBC 9.8, down from 10.8. Patient was given 2u pRBC with increase to 10.3, then 9.0 on the day of discharge.  07/23/2017  Information for the patient's newborn:  Samuel GermanyRcom, Boy Chianne  [[id number]][[id number]]  Delivery Method: Vag-Spont   Pateint otherwise had an uncomplicated postpartum course.  She is ambulating, tolerating a regular diet, passing flatus, and urinating well. BP within normal limits. Patient is discharged home in stable condition on 07/24/17.  Physical exam  Vitals:   07/23/17 0715 07/23/17 1003 07/23/17 1746 07/24/17 0629  BP: 121/80 117/76 133/74 120/79  Pulse: 92 (!) 57 87 81  Resp: 18 16 18 18   Temp: 98.1 F (36.7 C) 97.7 F (36.5 C) 98 F (36.7 C) 97.9 F (36.6 C)  TempSrc: Oral Oral Oral Oral  SpO2: 99% 98%    Weight:      Height:       General: alert, cooperative and no distress Lochia: appropriate Uterine Fundus: firm Incision: N/A DVT Evaluation: Negative Homan's sign. Labs: Lab Results  Component Value Date   WBC 15.0 (H) 07/24/2017   HGB 9.0 (L) 07/24/2017   HCT 26.6 (L) 07/24/2017   MCV 64.1 (L) 07/24/2017   PLT  141 (L) 07/24/2017   CMP Latest Ref Rng & Units 07/22/2017  Glucose 65 - 99 mg/dL 70  BUN 6 - 20 mg/dL 8  Creatinine 1.61 - 0.96 mg/dL 0.45  Sodium 409 - 811 mmol/L 136  Potassium 3.5 - 5.1 mmol/L 3.8  Chloride 101 - 111 mmol/L 107  CO2 22 - 32 mmol/L 20(L)  Calcium 8.9 - 10.3 mg/dL 9.1(Y)  Total Protein 6.5 - 8.1 g/dL 6.6  Total Bilirubin 0.3 - 1.2 mg/dL 0.5  Alkaline Phos 38 - 126 U/L 268(H)  AST 15 - 41 U/L 17  ALT 14 - 54 U/L 9(L)    Discharge instruction: per After Visit Summary and "Baby and Me Booklet".  After visit meds:  Allergies as of 07/24/2017   No Known Allergies     Medication List    TAKE these medications   ibuprofen 600 MG tablet Commonly known as:  ADVIL,MOTRIN Take 1 tablet (600 mg total) by mouth every 6 (six) hours.   PREPLUS 27-1 MG Tabs Take 1 tablet by mouth daily.       Diet: routine diet  Activity: Advance as tolerated. Pelvic rest for 6 weeks.   Outpatient follow up:4 weeks postpartum Follow up Appt:No future appointments. Follow up Visit:No follow-ups on  file.  Postpartum contraception: Condoms  Newborn Data: Live born female  Birth Weight: 7 lb 1.2 oz (3210 g) APGAR: 8, 9  Newborn Delivery   Birth date/time:  07/23/2017 00:59:00 Delivery type:  Vaginal, Spontaneous     Baby Feeding: Breast Disposition:home with mother   07/24/2017 Ellwood Dense, DO  I confirm that I have verified the information documented in the resident's note and that I have also personally reperformed the physical exam and all medical decision making activities.  Rolm Bookbinder, CNM 07/24/2017 09:00am

## 2017-07-25 ENCOUNTER — Encounter: Payer: Self-pay | Admitting: *Deleted

## 2017-07-25 NOTE — Discharge Summary (Signed)
OB Discharge Summary    Patient Name: Renee Carson DOB: 09/11/1980 MRN: [id number]  Date of admission: 07/22/2017 Delivering MD: Renee Carson, Renee Carson   Date of discharge: 07/25/2017  Admitting diagnosis: LABOR Intrauterine pregnancy: 4623w6d     Secondary diagnosis:  Principal Problem:   SVD (spontaneous vaginal delivery) Active Problems:   Language barrier   Advanced maternal age in multigravida, second trimester   Chronic hypertension during pregnancy, antepartum   Late prenatal care   Group B Streptococcus carrier, +RV culture, currently pregnant   Chronic hypertension during pregnancy   Indication for care in labor or delivery   Postpartum hemorrhage  Additional problems: None     Discharge diagnosis: Term Pregnancy Delivered, CHTN and PPH                                                                                                Post partum procedures:blood transfusion, curettage  Augmentation: Pitocin  Complications: Hemorrhage>103400mL  Hospital course:  Onset of Labor With Vaginal Delivery     37 Carson.o. yo G5P5005 at 2923w6d was admitted in Active Labor on 07/22/2017. Patient had a labor course as follows:  Membrane Rupture Time/Date: 2:52 PM ,07/22/2017   Intrapartum Procedures: Episiotomy: None [1]                                         Lacerations:  None [1]  Patient had a delivery of a Viable infant. Patient had a retained placenta that was manually extracted, examined to be intact but with large clots with removal and signs of placental ecchymosis concerning for abruption. Patient then underwent uterine curettage under US guidance with clots removed but no missing cotyledons or retained tissue. Patient continued to have heavy bleeding with boggy uterus, was given cytotec, hemabate, and TXA. Follow up CBC 9.8, down from 10.8. Patient was given 2u pRBC with increase to 10.3, then 9.0 on the day of discharge.   07/23/2017  Information for the patient's newborn:  Renee Carson, Boy Renee Carson  [[id number]][[id number]]  Delivery Method: Vag-Spont   Pateint otherwise had an uncomplicated postpartum course.  She is ambulating, tolerating a regular diet, passing flatus, and urinating well. BP within normal limits. Patient is discharged home in stable condition on 07/25/17.  Physical exam  Vitals:   07/23/17 1003 07/23/17 1746 07/24/17 0629 07/25/17 0514  BP: 117/76 133/74 120/79 119/76  Pulse: (!) 57 87 81 76  Resp: 16 18 18 16   Temp: 97.7 F (36.5 C) 98 F (36.7 C) 97.9 F (36.6 C) 97.9 F (36.6 C)  TempSrc: Oral Oral Oral Oral  SpO2: 98%   98%  Weight:      Height:       General: alert, cooperative and no distress Lochia: appropriate Uterine Fundus: firm Incision: N/A DVT Evaluation: Negative Homan's sign. Labs: Lab Results  Component Value Date   WBC 15.0 (H) 07/24/2017   HGB 9.0 (L) 07/24/2017   HCT 26.6 (L) 07/24/2017   MCV 64.1 (L) 07/24/2017  PLT 141 (L) 07/24/2017   CMP Latest Ref Rng & Units 07/22/2017  Glucose 65 - 99 mg/dL 70  BUN 6 - 20 mg/dL 8  Creatinine 4.09 - 8.11 mg/dL 9.14  Sodium 782 - 956 mmol/L 136  Potassium 3.5 - 5.1 mmol/L 3.8  Chloride 101 - 111 mmol/L 107  CO2 22 - 32 mmol/L 20(L)  Calcium 8.9 - 10.3 mg/dL 2.1(H)  Total Protein 6.5 - 8.1 g/dL 6.6  Total Bilirubin 0.3 - 1.2 mg/dL 0.5  Alkaline Phos 38 - 126 U/L 268(H)  AST 15 - 41 U/L 17  ALT 14 - 54 U/L 9(L)    Discharge instruction: per After Visit Summary and "Baby and Me Booklet".  After visit meds:  Allergies as of 07/25/2017   No Known Allergies     Medication List    TAKE these medications   ibuprofen 600 MG tablet Commonly known as:  ADVIL,MOTRIN Take 1 tablet (600 mg total) by mouth every 6 (six) hours.   PREPLUS 27-1 MG Tabs Take 1 tablet by mouth daily.       Diet: routine diet  Activity: Advance as tolerated. Pelvic rest for 6 weeks.   Outpatient follow up:4 weeks postpartum Follow up Appt:No future appointments. Follow up Visit: Follow-up Information     [facility].   Specialty:  Obstetrics and Gynecology Why:  4 weeks for postpartum visit Contact information: [address] [place] [address] [phone]          Postpartum contraception: Condoms  Newborn Data: Live born female  Birth Weight: 7 lb 1.2 oz (3210 g) APGAR: 8, 9  Newborn Delivery   Birth date/time:  07/23/2017 00:59:00 Delivery type:  Vaginal, Spontaneous     Baby Feeding: Breast Disposition:home with mother   07/25/2017 Renee Ada, DO

## 2017-07-25 NOTE — Lactation Note (Signed)
This note was copied from a baby's chart. Lactation Consultation Note  Patient Name: Renee Carson Today's Date: 07/25/2017  Patients older daughter here and will interpret for us.  Mom states baby is latching well and she has no concerns.  Discussed milk coming to volume and engorgement treatment.  Mom plans on both breastfeeding and formula feeding.  Manual pump given with instructions.  Lactation outpatient services reviewed and encouraged prn.   Maternal Data    Feeding Feeding Type: Breast Fed Length of feed: 15 min  LATCH Score                   Interventions    Lactation Tools Discussed/Used     Consult Status      Huston FoleyMOULDEN, Danzel Marszalek S 07/25/2017, 9:54 AM

## 2017-07-26 ENCOUNTER — Encounter: Payer: Self-pay | Admitting: Obstetrics and Gynecology

## 2017-07-26 ENCOUNTER — Other Ambulatory Visit: Payer: Self-pay

## 2017-07-26 LAB — TYPE AND SCREEN
ABO/RH(D): A POS
ANTIBODY SCREEN: NEGATIVE
UNIT DIVISION: 0
UNIT DIVISION: 0
UNIT DIVISION: 0
Unit division: 0
Unit division: 0

## 2017-07-26 LAB — BPAM RBC
BLOOD PRODUCT EXPIRATION DATE: 201904062359
BLOOD PRODUCT EXPIRATION DATE: 201904152359
BLOOD PRODUCT EXPIRATION DATE: 201904232359
Blood Product Expiration Date: 201904162359
Blood Product Expiration Date: 201904222359
ISSUE DATE / TIME: 201903300241
ISSUE DATE / TIME: 201903300442
UNIT TYPE AND RH: 600
UNIT TYPE AND RH: 6200
UNIT TYPE AND RH: 9500
Unit Type and Rh: 600
Unit Type and Rh: 6200

## 2017-07-31 ENCOUNTER — Inpatient Hospital Stay (HOSPITAL_COMMUNITY): Admission: RE | Admit: 2017-07-31 | Payer: Medicaid Other | Source: Ambulatory Visit

## 2017-08-02 ENCOUNTER — Encounter: Payer: Self-pay | Admitting: Obstetrics & Gynecology

## 2017-08-02 ENCOUNTER — Other Ambulatory Visit: Payer: Self-pay

## 2017-09-28 ENCOUNTER — Ambulatory Visit (INDEPENDENT_AMBULATORY_CARE_PROVIDER_SITE_OTHER): Payer: Self-pay | Admitting: Nurse Practitioner

## 2017-09-28 ENCOUNTER — Encounter: Payer: Self-pay | Admitting: Nurse Practitioner

## 2017-09-28 NOTE — Progress Notes (Signed)
Subjective:     Renee Carson is a 37 y.o. female who presents for a postpartum visit. She is 9 weeks postpartum following a spontaneous vaginal delivery. I have fully reviewed the prenatal and intrapartum course. She was diagnosed with chronic hypertension during her prenatal course but she was not on medication.  The delivery was at 4074w6d gestational weeks and she did have a postpartum hemorrhage. Outcome: spontaneous vaginal delivery. Anesthesia: epidural. Postpartum course has been uncomplicated. Baby's course has been progressing well. Baby is feeding by breast. Bleeding no bleeding. Bowel function is normal. Bladder function is normal. Patient is not sexually active. Contraception method is none. Postpartum depression screening: negative.  The following portions of the patient's history were reviewed and updated as appropriate: allergies, current medications, past family history, past medical history, past social history, past surgical history and problem list.  Review of Systems Pertinent items noted in HPI and remainder of comprehensive ROS otherwise negative.   Objective:    BP 129/90   Pulse 89   Wt 112 lb 14.4 oz (51.2 kg)   Breastfeeding? Yes   BMI 20.00 kg/m   General:  alert, cooperative and no distress   Breasts:  deferred  Lungs: clear to auscultation bilaterally  Heart:  regular rate and rhythm, S1, S2 normal, no murmur, click, rub or gallop  Abdomen: no masses, nontender  Pelvic exam:  deferred     Assessment:    Normal postpartum exam. Pap smear not done at today's visit.   Plan:    1. Contraception: abstinence  Some condoms given - right now husband works out of town and she is not having sex.  Wants to call back later in her breastfeeding and will take pills.  Declined pills today - wants to wait.  Condoms given. 2. BP slightly elevated today with diastolic of 90.  Has not been on any medication during the pregnancy and highest BP was 149/98.  Discussed monitoring her  BP periodically to make sure it is not increasing.  Especially if she is having lots of headaches, she will need to have BP checked.  Currently she has no insurance now that pregnancy medicaid has ended.   Advised to see PCP and client is agreeable if she can pay out of pocket. 3. Follow up in: 1 year or as needed.   Client to call when she wants to take pills for contraception.

## 2017-09-28 NOTE — Patient Instructions (Signed)
Call back when you decide you want to have pills. Have your blood pressure checked periodically. If it goes higher, you may need medication.  Or if you are having lots of headaches, you may need to get your blood pressure checked to see if it is high.

## 2018-07-08 ENCOUNTER — Other Ambulatory Visit: Payer: Self-pay | Admitting: Certified Nurse Midwife

## 2018-07-08 DIAGNOSIS — Z3480 Encounter for supervision of other normal pregnancy, unspecified trimester: Secondary | ICD-10-CM

## 2018-07-08 DIAGNOSIS — O09522 Supervision of elderly multigravida, second trimester: Secondary | ICD-10-CM

## 2018-08-02 ENCOUNTER — Other Ambulatory Visit: Payer: Self-pay

## 2018-08-02 NOTE — Telephone Encounter (Signed)
Attempted to call patient regarding request for prenatal vitamins. Will call in one more refill

## 2019-08-13 IMAGING — US US MFM OB FOLLOW-UP
2 series · 14 of 28 positions shown · non-contrast
Comparison: none

[Series 1: us mfm ob follow-up · 28 acquisitions, 11 frames shown (1 of 2)]
[im 2/28]
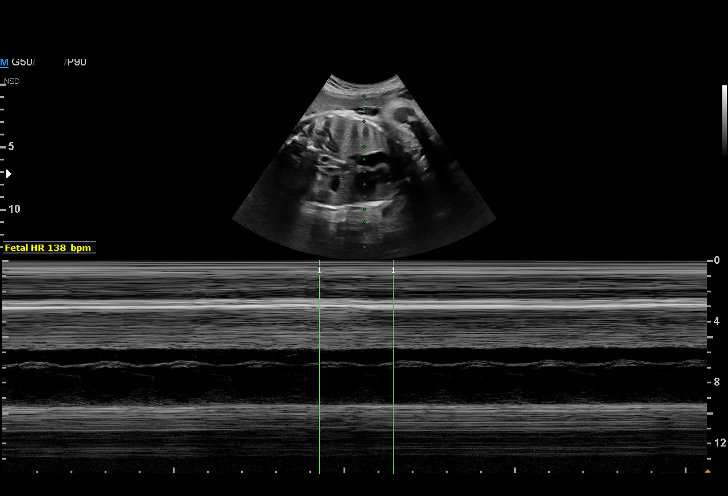
[im 4/28]
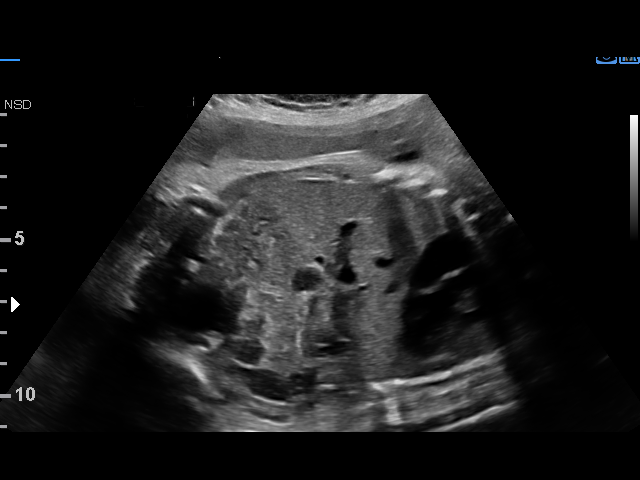
[im 7/28]
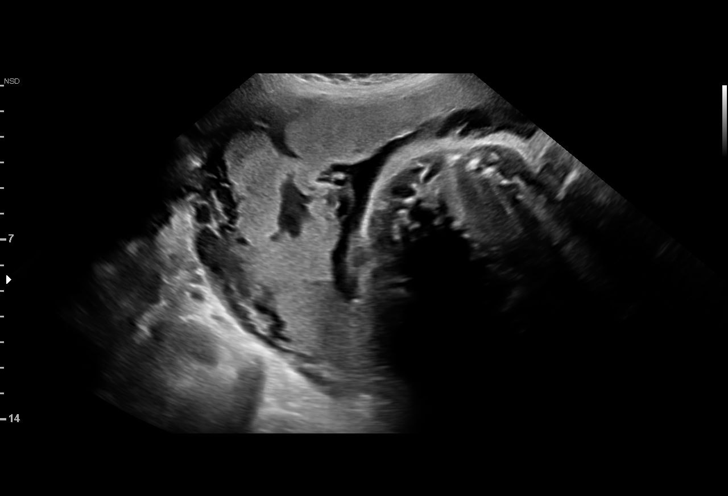
[im 10/28]
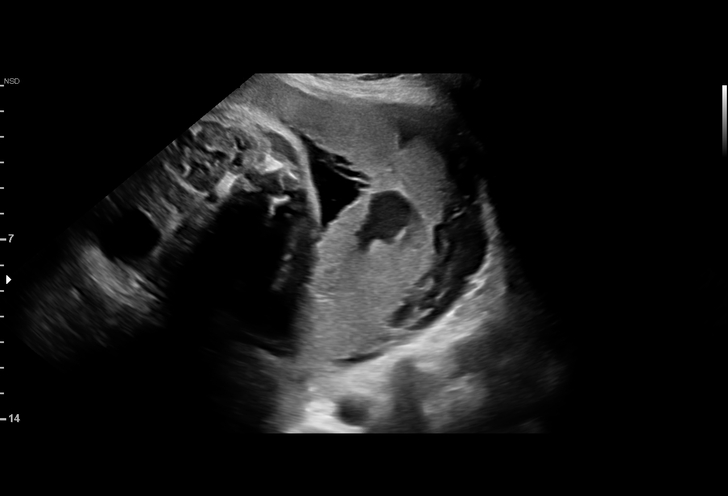
[im 12/28]
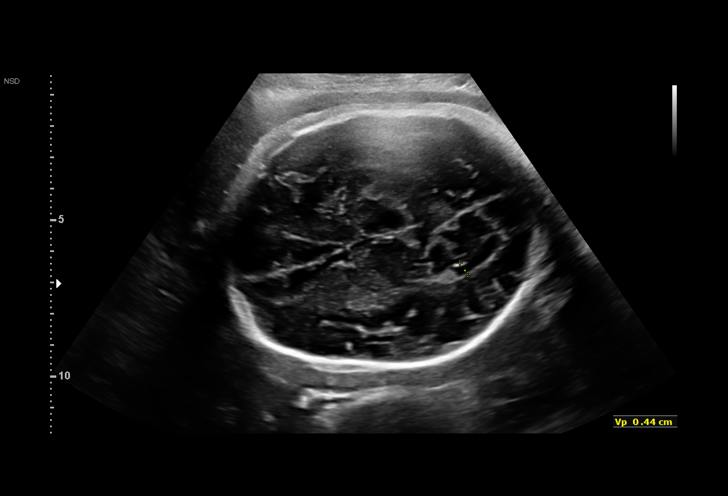
[im 15/28]
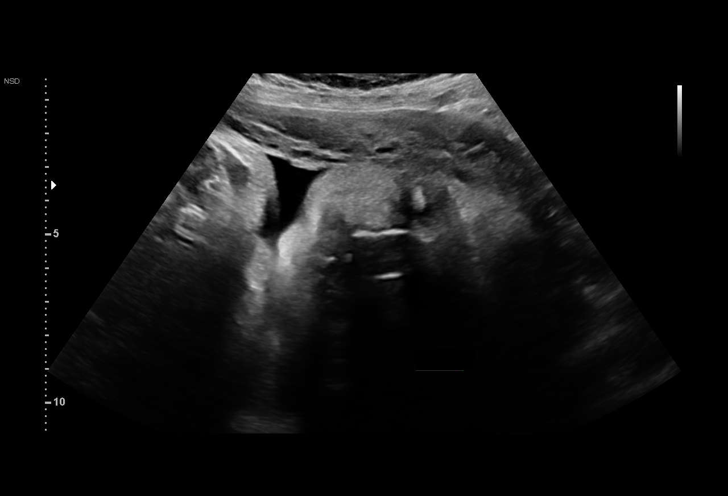
[im 17/28]
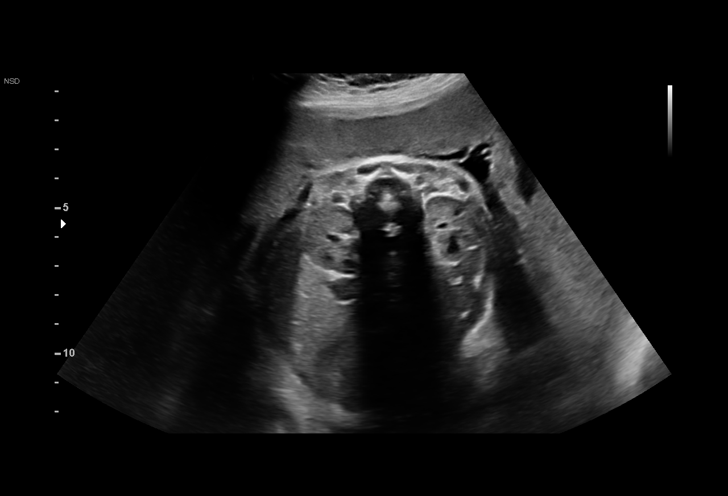
[im 20/28]
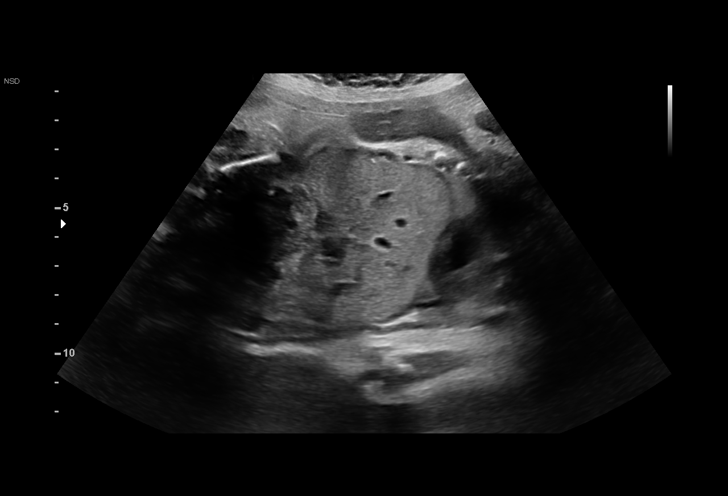
[im 22/28]
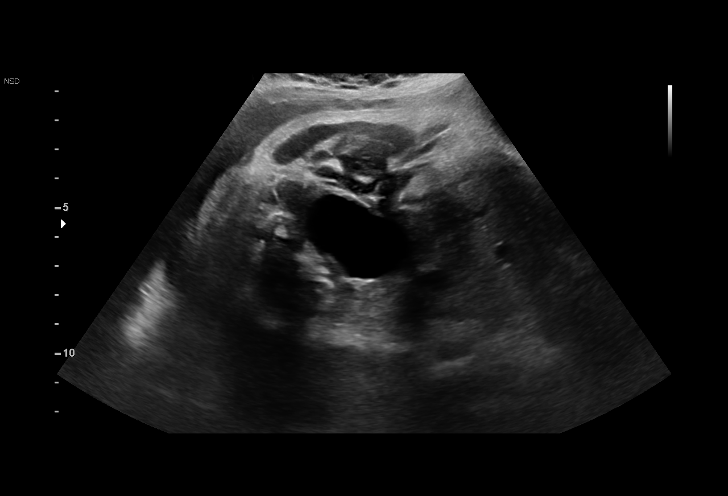
[im 25/28]
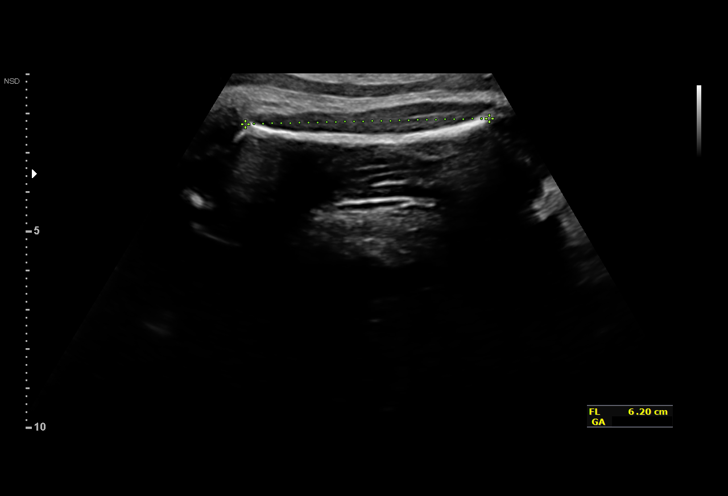
[im 28/28]
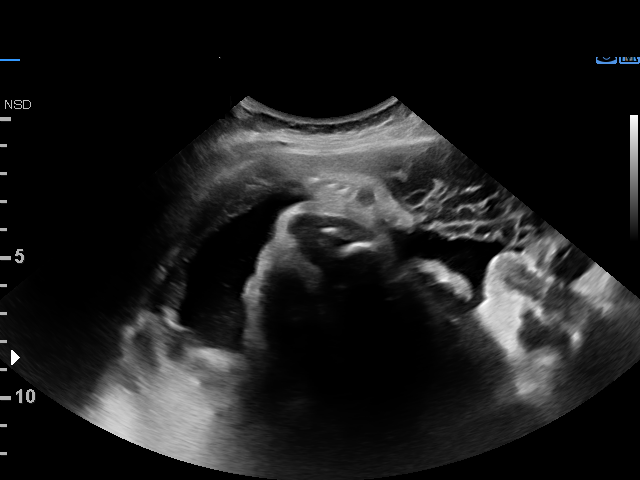

[Series 3: us mfm ob follow-up · 7 acquisitions, 3 frames shown (2 of 2)]
[im 2/7]
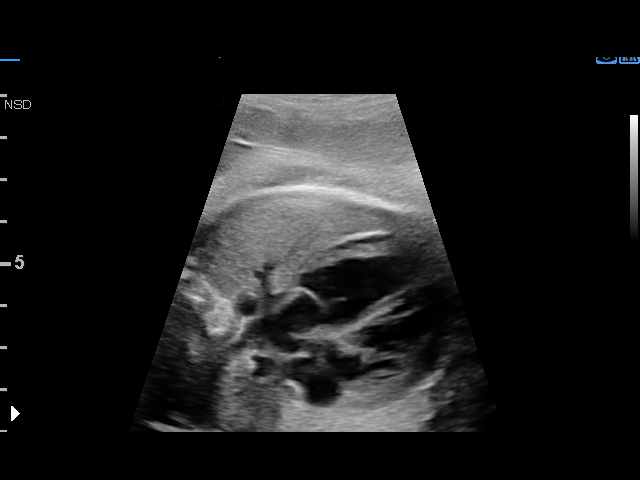
[im 4/7]
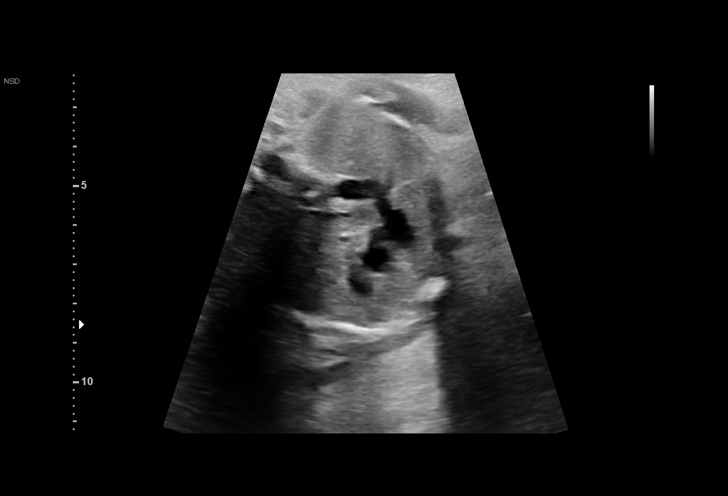
[im 7/7]
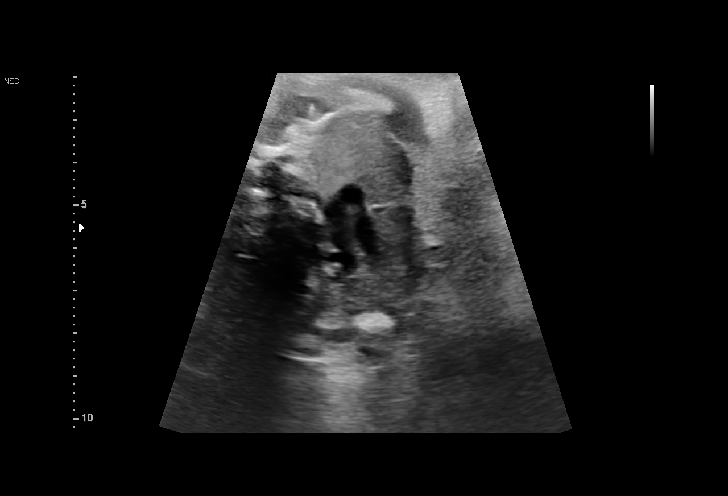

[14 of 28 positions shown; findings below may reference images not displayed]

OB/Gyn Clinic

Indications

33 weeks gestation of pregnancy
Hypertension - Chronic/Pre-existing
Encounter for other antenatal screening
follow-up
OB History

Blood Type:            Height:  5'2"   Weight (lb):  128       BMI:
Gravidity:    5         Term:   4        Prem:   0        SAB:   0
TOP:          0       Ectopic:  0        Living: 4
Fetal Evaluation

Num Of Fetuses:     1
Fetal Heart         138
Rate(bpm):
Cardiac Activity:   Observed
Presentation:       Cephalic
Placenta:           Fundal, above cervical os
P. Cord Insertion:  Previously seen as normal

Amniotic Fluid
AFI FV:      Subjectively within normal limits

AFI Sum(cm)     %Tile       Largest Pocket(cm)
11.61           30
RUQ(cm)       RLQ(cm)       LUQ(cm)        LLQ(cm)
2.37
Biophysical Evaluation

Amniotic F.V:   Pocket => 2 cm two         F. Tone:        Observed
planes
F. Movement:    Observed                   Score:          [DATE]
F. Breathing:   Observed
Biometry

BPD:      83.3  mm     G. Age:  33w 4d         48  %    CI:        77.56   %    70 - 86
FL/HC:      20.8   %    19.9 -
HC:      299.4  mm     G. Age:  33w 1d         12  %    HC/AC:      1.06        0.96 -
AC:      282.9  mm     G. Age:  32w 2d         22  %    FL/BPD:     74.7   %    71 - 87
FL:       62.2  mm     G. Age:  32w 1d         13  %    FL/AC:      22.0   %    20 - 24

Est. FW:    9683  gm      4 lb 6 oz     40  %
Gestational Age

LMP:           33w 3d        Date:  10/24/16                 EDD:   07/31/17
U/S Today:     32w 6d                                        EDD:   08/04/17
Best:          33w 3d     Det. By:  LMP  (10/24/16)          EDD:   07/31/17
Anatomy

Cranium:               Appears normal         Aortic Arch:            Previously seen
Cavum:                 Previously seen        Ductal Arch:            Previously seen
Ventricles:            Appears normal         Diaphragm:              Appears normal
Choroid Plexus:        Previously seen        Stomach:                Appears normal, left
sided
Cerebellum:            Previously seen        Abdomen:                Appears normal
Posterior Fossa:       Previously seen        Abdominal Wall:         Previously seen
Nuchal Fold:           Not applicable (>20    Cord Vessels:           Previously seen
wks GA)
Face:                  Orbits and profile     Kidneys:                Appear normal
previously seen
Lips:                  Previously seen        Bladder:                Appears normal
Thoracic:              Appears normal         Spine:                  Previously seen
Heart:                 Appears normal         Upper Extremities:      Previously seen
(4CH, axis, and situs
RVOT:                  Previously seen        Lower Extremities:      Previously seen
LVOT:                  Appears normal

Other:  Male gender previously seen. Heels, 5th digit, Open hands, and
Nasal bone previously visualized.
Cervix Uterus Adnexa

Cervix
Not visualized (advanced GA >62wks)
Impression

Singleton intrauterine pregnancy at 33+3 weeks with CHTN
here for growth and BPP
Interval review of the anatomy shows no sonographic
markers for aneuploidy or structural anomalies
All relevant fetal anatomy has been visualized
Amniotic fluid volume is normal
Estimated fetal weight shows growth in the 40th percentile
BPP [DATE]
Recommendations

Recommend follow-up ultrasound examnination in 4 weeks
for final growth evaluation
Continue antepartum testing through delivery

## 2019-09-02 IMAGING — US US FETAL BPP W/ NON-STRESS
1 series · 13 of 13 positions shown · non-contrast
Comparison: none

[Series 1: us fetal bpp w/nonstress · 13 acquisitions, 13 frames shown]
[im 1/13]
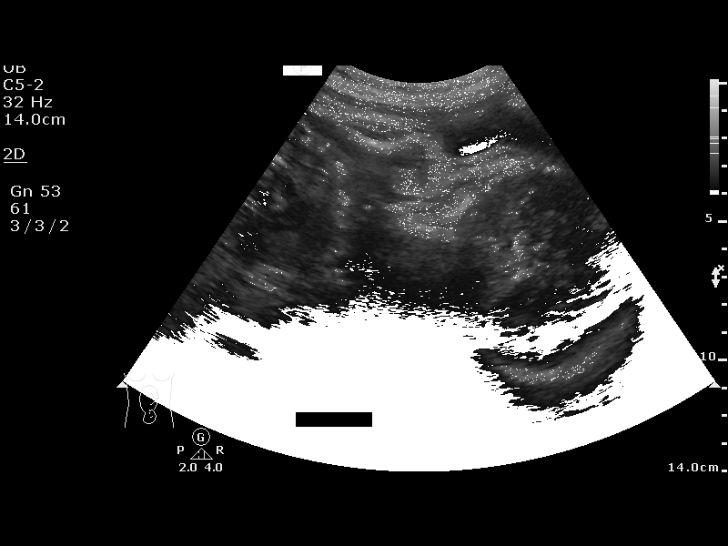
[im 2/13]
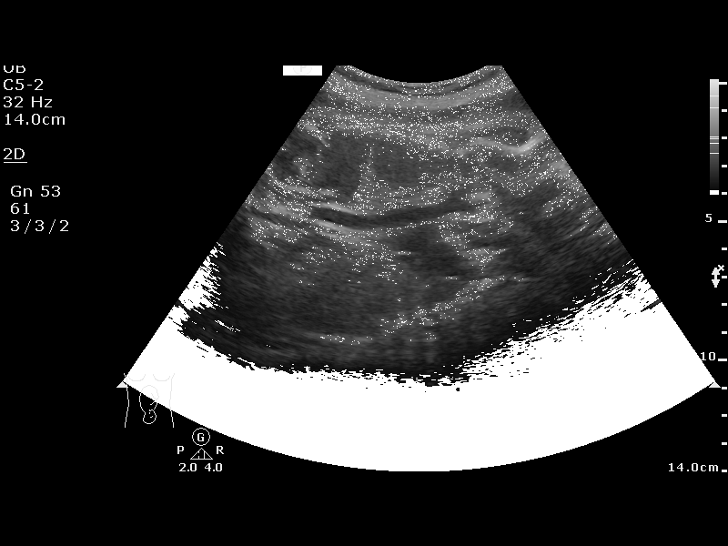
[im 3/13]
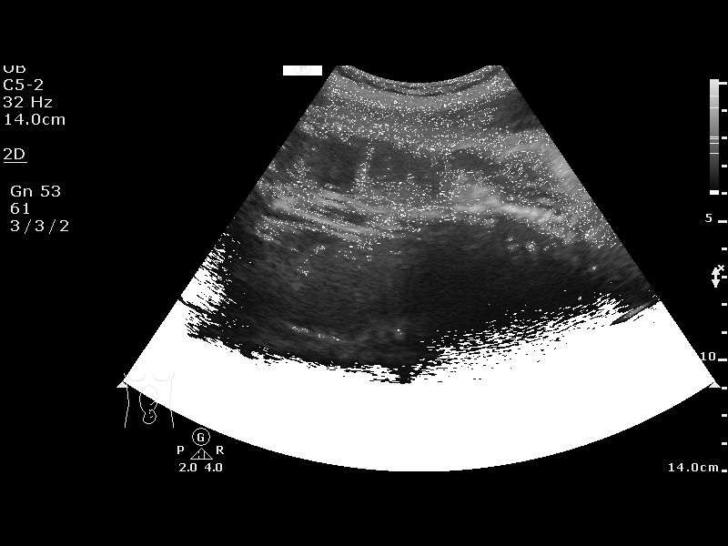
[im 4/13]
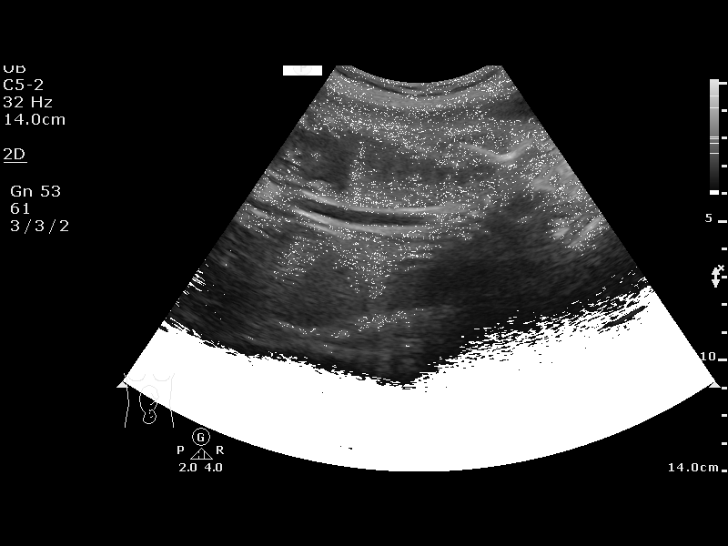
[im 5/13]
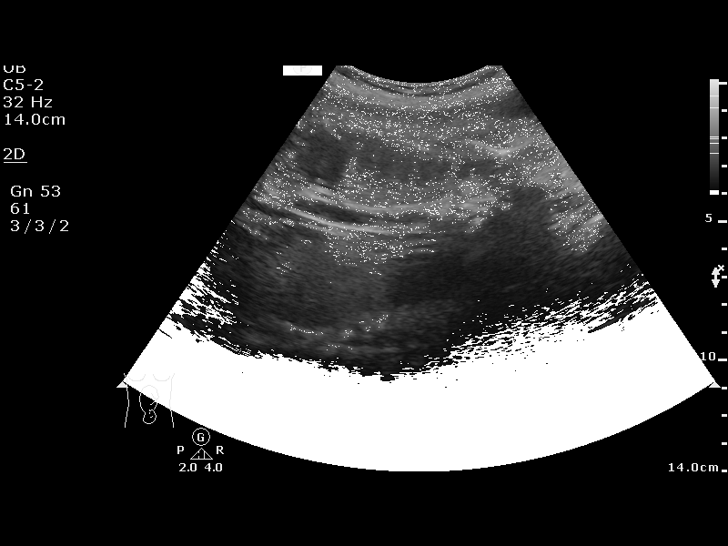
[im 6/13]
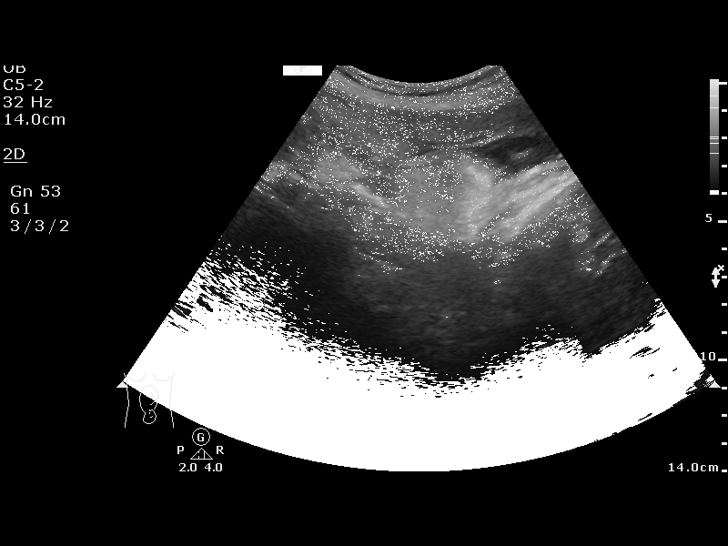
[im 7/13]
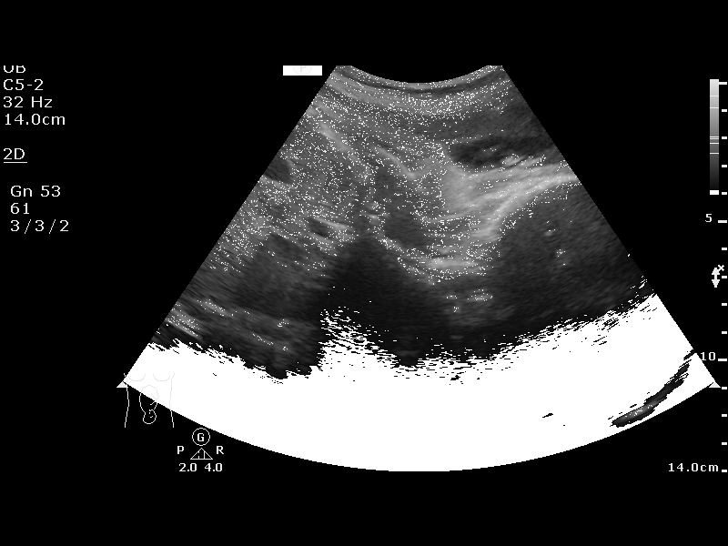
[im 8/13]
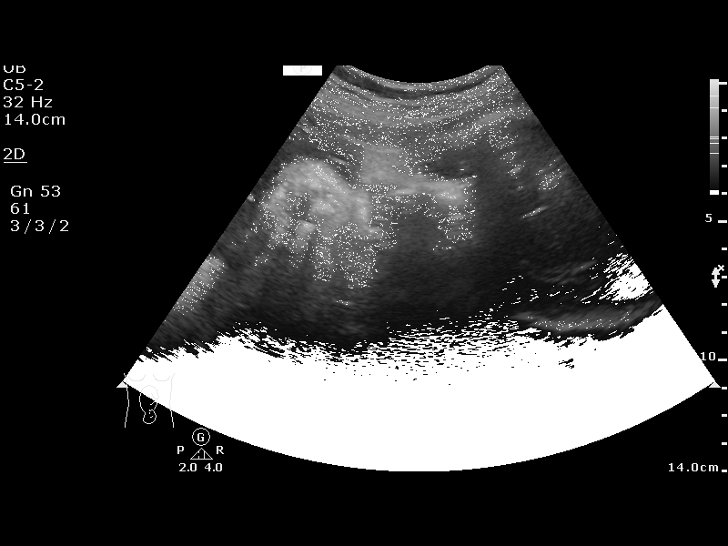
[im 9/13]
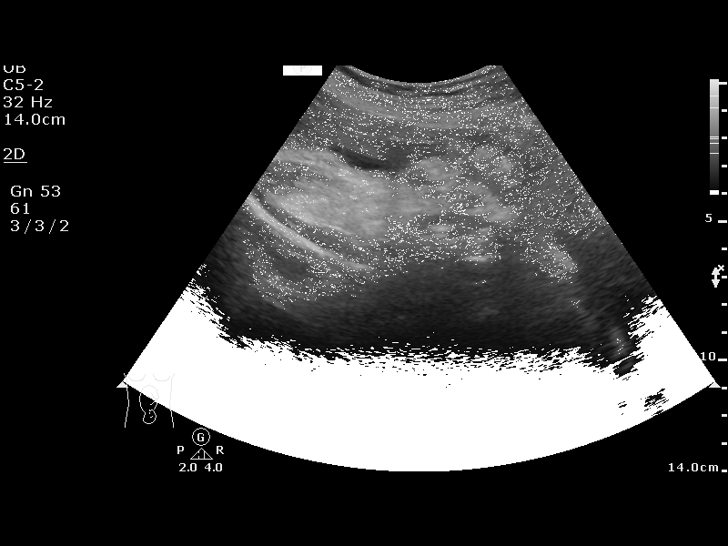
[im 10/13]
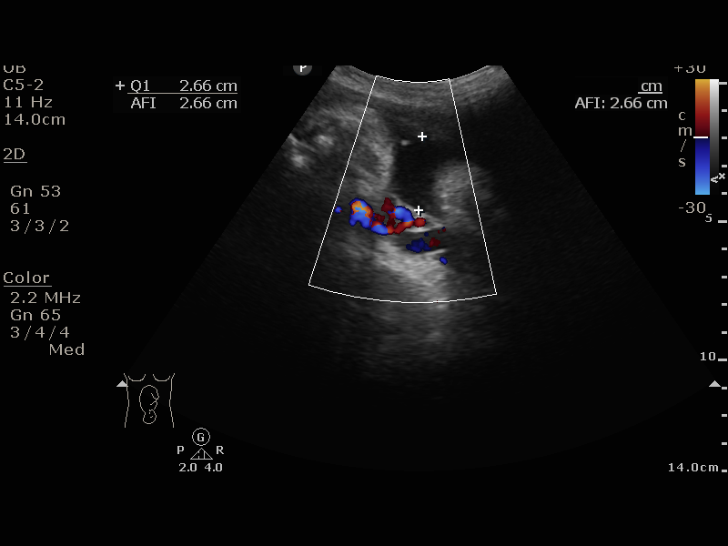
[im 11/13]
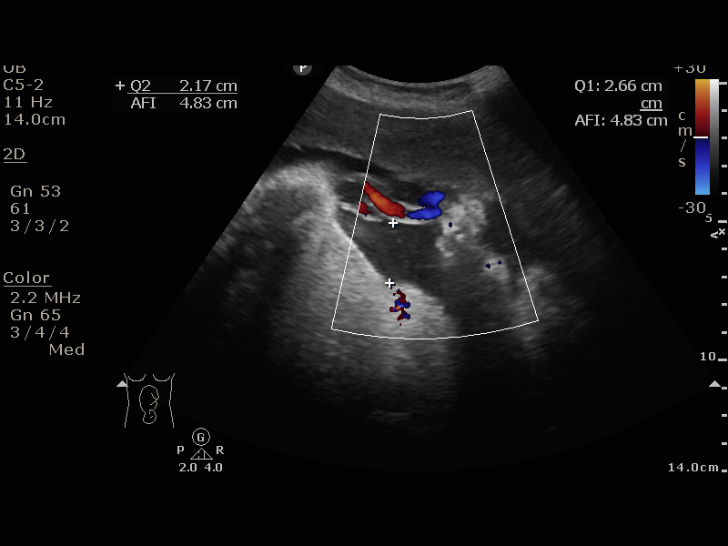
[im 12/13]
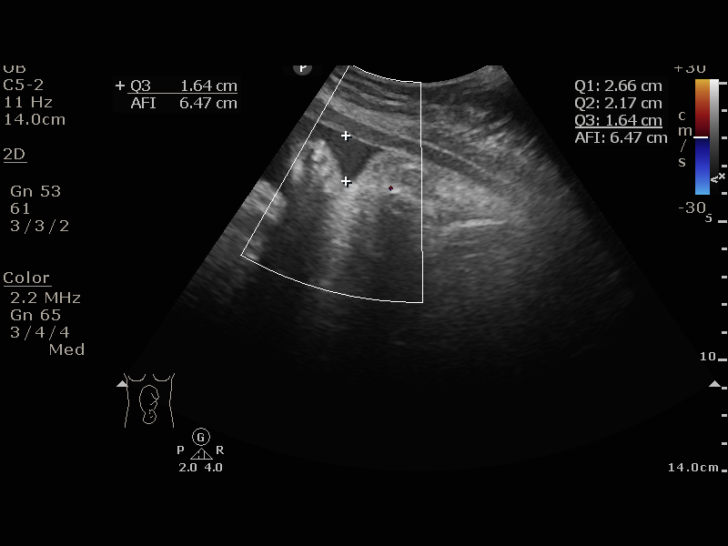
[im 13/13]
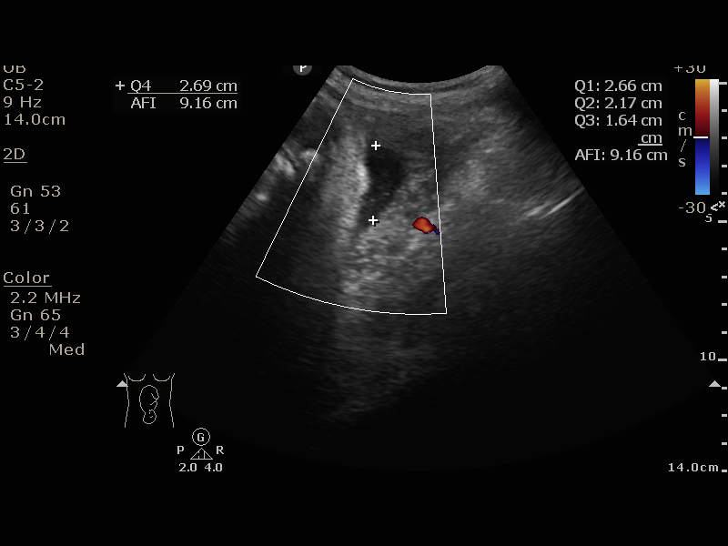

[13 of 13 positions shown; findings below may reference images not displayed]

OB/Gyn Clinic
Attending:        Iintas Zzipersada         Location:         [facility]
[REDACTED]

1  US FETAL BPP W/NONSTRESS                    76818.4

1  PAULUS N CEEJAY           [id number]      [id number]     [id number]
Service(s) Provided

Indications

36 weeks gestation of pregnancy
Unspecified pre-existing hypertension
complicating pregnancy, third trimester
OB History

Blood Type:            Height:  5'2"   Weight (lb):  128       BMI:
Gravidity:    5         Term:   4        Prem:   0        SAB:   0
TOP:          0       Ectopic:  0        Living: 4
Fetal Evaluation

Num Of Fetuses:     1
Preg. Location:     Intrauterine
Cardiac Activity:   Observed
Presentation:       Cephalic

Amniotic Fluid
AFI FV:      Subjectively low-normal

AFI Sum(cm)     %Tile       Largest Pocket(cm)
9.16            16
RUQ(cm)       RLQ(cm)       LUQ(cm)        LLQ(cm)
2.66
Biophysical Evaluation

Amniotic F.V:   Pocket => 2 cm two         F. Tone:        Observed
planes
F. Movement:    Observed                   N.S.T:          Reactive
F. Breathing:   Observed                   Score:          [DATE]
Gestational Age

LMP:           36w 2d        Date:  10/24/16                 EDD:   07/31/17
Best:          36w 2d     Det. By:  LMP  (10/24/16)          EDD:   07/31/17
Impression

IUP at  25w4d
Normal amniotic fluid volume
Recommendations

Continue recommended antenatal testing

## 2022-04-26 NOTE — L&D Delivery Note (Signed)
OB/GYN Faculty Practice Delivery Note  [name] is a 42 y.o. G6P 6006 s/p VD at [redacted]w[redacted]d. She was admitted for IOL CHTN.   ROM: 3h 7m with clear fluid GBS Status: Positive/-- (07/31 1621) Maximum Maternal Temperature: 98 F  Labor Progress: Initial SVE: 4/50/-3. She then progressed to complete.   Delivery Date/Time: 12/03/2022 at 2243 Delivery: Called to room and patient was complete and pushing. Head delivered direct OA. No nuchal cord present. Shoulder and body delivered in usual fashion. Infant with spontaneous cry, placed on mother's abdomen, dried and stimulated. Cord clamped x 2 after 1-minute delay, and cut by daughter. Cord blood drawn. Placenta delivered spontaneously with gentle cord traction. Fundus firm with massage and Pitocin. Labia, perineum, vagina, and cervix inspected with no lacerations.  Baby Weight: pending  Placenta: 3 vessel, intact. Sent to L&D Complications: None Lacerations: As above EBL: 176 mL Analgesia: Epidural  Infant:  APGAR (1 MIN): 9  APGAR (5 MINS): 9   Myrtie Hawk, DO OB Family Medicine Fellow, [facility] for [employer], [facility] 12/03/2022, 11:29 PM

## 2022-09-06 ENCOUNTER — Encounter: Payer: Self-pay | Admitting: *Deleted

## 2022-10-05 ENCOUNTER — Other Ambulatory Visit (HOSPITAL_COMMUNITY)
Admission: RE | Admit: 2022-10-05 | Discharge: 2022-10-05 | Disposition: A | Discharge status: Home / Self Care | Payer: 59 | Source: Ambulatory Visit | Attending: Family Medicine | Admitting: Family Medicine

## 2022-10-05 ENCOUNTER — Ambulatory Visit (INDEPENDENT_AMBULATORY_CARE_PROVIDER_SITE_OTHER): Payer: 59

## 2022-10-05 ENCOUNTER — Other Ambulatory Visit: Payer: Self-pay

## 2022-10-05 VITALS — BP 131/87 | HR 92 | Wt 129.7 lb

## 2022-10-05 DIAGNOSIS — O0990 Supervision of high risk pregnancy, unspecified, unspecified trimester: Secondary | ICD-10-CM | POA: Insufficient documentation

## 2022-10-05 DIAGNOSIS — O0992 Supervision of high risk pregnancy, unspecified, second trimester: Secondary | ICD-10-CM

## 2022-10-05 DIAGNOSIS — Z3A24 24 weeks gestation of pregnancy: Secondary | ICD-10-CM

## 2022-10-05 MED ORDER — PRENATAL PLUS 27-1 MG PO TABS: 1.0000 | ORAL_TABLET | Freq: Every day | ORAL | 11 refills | Status: DC

## 2022-10-05 NOTE — Progress Notes (Signed)
New OB Intake  I connected with Renee Carson  on 10/05/22 at  1:15 PM EDT by In Person Video Visit and verified that I am speaking with the correct person using two identifiers. Nurse is located at [facility] and pt is located at [employer].  I discussed the limitations, risks, security and privacy concerns of performing an evaluation and management service by telephone and the availability of in person appointments. I also discussed with the patient that there may be a patient responsible charge related to this service. The patient expressed understanding and agreed to proceed.  I explained I am completing New OB Intake today. We discussed EDD of 01/21/2023, by Last Menstrual Period. Pt is G6P5005. I reviewed her allergies, medications and Medical/Surgical/OB history.    Patient Active Problem List   Diagnosis Date Noted   Supervision of high risk pregnancy, antepartum 10/05/2022   Chronic hypertension during pregnancy 07/22/2017   Chronic hypertension during pregnancy, antepartum 05/12/2017   ASCUS of cervix with negative high risk HPV 05/12/2017   Language barrier 06/02/2011    Concerns addressed today  Delivery Plans Plans to deliver at [facility]. Discussed the nature of our practice with multiple providers including residents and students. Due to the size of the practice, the delivering provider may not be the same as those providing prenatal care.   Patient is not interested in water birth. Offered upcoming OB visit with CNM to discuss further.  MyChart/Babyscripts MyChart access verified. I explained pt will have some visits in office and some virtually. Babyscripts instructions given and order placed. Patient verifies receipt of registration text/e-mail. Account successfully created and app downloaded.  Blood Pressure Cuff/Weight Scale Patient has private insurance; instructed to purchase blood pressure cuff and bring to first prenatal appt. Explained after first prenatal appt pt will  check weekly and document in Babyscripts. Patient does have weight scale.  Anatomy US Explained first scheduled [place] will be around 19 weeks. Anatomy US scheduled for 11/05/22 at 1015A.  Is patient a CenteringPregnancy candidate?  Not a Candidate  Not a candidate due to Language barrier If accepted,    Is patient a Mom+Baby Combined Care candidate?  Not a candidate   If accepted, confirm patient does not intend to move from the area for at least 12 months, then notify Mom+Baby staff  Interested in [place]? If yes, send referral and doula dot phrase.    First visit review I reviewed new OB appt with patient. Explained pt will be seen by Dr.Newton at first visit. Discussed Avelina Laine genetic screening with patient.  Panorama and Horizon.. Routine prenatal labs  collected today    Last Pap Diagnosis  Date Value Ref Range Status  04/22/2017 (A)  Final   ATYPICAL SQUAMOUS CELLS OF UNDETERMINED SIGNIFICANCE (ASC-US).    Henrietta Dine, CMA 10/05/2022  4:55 PM

## 2022-10-06 LAB — COMPREHENSIVE METABOLIC PANEL
ALT: 13 IU/L (ref 0–32)
AST: 13 IU/L (ref 0–40)
Albumin/Globulin Ratio: 1.4
Albumin: 3.9 g/dL (ref 3.9–4.9)
Alkaline Phosphatase: 94 IU/L (ref 44–121)
BUN/Creatinine Ratio: 9 (ref 9–23)
BUN: 4 mg/dL — ABNORMAL LOW (ref 6–24)
Bilirubin Total: 0.3 mg/dL (ref 0.0–1.2)
CO2: 20 mmol/L (ref 20–29)
Calcium: 8.6 mg/dL — ABNORMAL LOW (ref 8.7–10.2)
Chloride: 107 mmol/L — ABNORMAL HIGH (ref 96–106)
Creatinine, Ser: 0.47 mg/dL — ABNORMAL LOW (ref 0.57–1.00)
Globulin, Total: 2.7 g/dL (ref 1.5–4.5)
Glucose: 69 mg/dL — ABNORMAL LOW (ref 70–99)
Potassium: 3.9 mmol/L (ref 3.5–5.2)
Sodium: 139 mmol/L (ref 134–144)
Total Protein: 6.6 g/dL (ref 6.0–8.5)
eGFR: 121 mL/min/{1.73_m2} (ref >59)

## 2022-10-06 LAB — HCV INTERPRETATION

## 2022-10-06 LAB — CBC/D/PLT+RPR+RH+ABO+RUBIGG...
Antibody Screen: NEGATIVE
Basophils Absolute: 0 10*3/uL (ref 0.0–0.2)
Basos: 1 %
EOS (ABSOLUTE): 0.4 10*3/uL (ref 0.0–0.4)
Eos: 5 %
HCV Ab: NONREACTIVE
HIV Screen 4th Generation wRfx: NONREACTIVE
Hematocrit: 35.2 % (ref 34.0–46.6)
Hemoglobin: 11.3 g/dL (ref 11.1–15.9)
Hepatitis B Surface Ag: NEGATIVE
Immature Grans (Abs): 0 10*3/uL (ref 0.0–0.1)
Immature Granulocytes: 0 %
Lymphocytes Absolute: 1.4 10*3/uL (ref 0.7–3.1)
Lymphs: 21 %
MCH: 22.1 pg — ABNORMAL LOW (ref 26.6–33.0)
MCHC: 32.1 g/dL (ref 31.5–35.7)
MCV: 69 fL — ABNORMAL LOW (ref 79–97)
Monocytes Absolute: 0.4 10*3/uL (ref 0.1–0.9)
Monocytes: 6 %
Neutrophils Absolute: 4.4 10*3/uL (ref 1.4–7.0)
Neutrophils: 67 %
Platelets: 230 10*3/uL (ref 150–450)
RBC: 5.11 x10E6/uL (ref 3.77–5.28)
RDW: 14.1 % (ref 11.7–15.4)
RPR Ser Ql: NONREACTIVE
Rh Factor: POSITIVE
Rubella Antibodies, IGG: 2.58 index (ref >0.99)
WBC: 6.6 10*3/uL (ref 3.4–10.8)

## 2022-10-06 LAB — HEMOGLOBIN A1C
Est. average glucose Bld gHb Est-mCnc: 103 mg/dL
Hgb A1c MFr Bld: 5.2 % (ref 4.8–5.6)

## 2022-10-06 LAB — GC/CHLAMYDIA PROBE AMP (~~LOC~~) NOT AT ARMC
Chlamydia: NEGATIVE
Comment: NEGATIVE
Comment: NORMAL
Neisseria Gonorrhea: NEGATIVE

## 2022-10-06 LAB — TSH: TSH: 1.8 u[IU]/mL (ref 0.450–4.500)

## 2022-10-06 LAB — PROTEIN / CREATININE RATIO, URINE
Creatinine, Urine: 19.7 mg/dL
Protein, Ur: <4 mg/dL

## 2022-10-07 LAB — CULTURE, OB URINE

## 2022-10-07 LAB — URINE CULTURE, OB REFLEX: Organism ID, Bacteria: NO GROWTH

## 2022-10-12 ENCOUNTER — Encounter: Payer: Self-pay | Admitting: Family Medicine

## 2022-10-12 ENCOUNTER — Ambulatory Visit: Payer: 59 | Admitting: Family Medicine

## 2022-10-12 ENCOUNTER — Other Ambulatory Visit: Payer: Self-pay

## 2022-10-12 VITALS — BP 134/86 | HR 98 | Wt 131.9 lb

## 2022-10-12 DIAGNOSIS — Z3A25 25 weeks gestation of pregnancy: Secondary | ICD-10-CM | POA: Diagnosis not present

## 2022-10-12 DIAGNOSIS — O0992 Supervision of high risk pregnancy, unspecified, second trimester: Secondary | ICD-10-CM | POA: Diagnosis not present

## 2022-10-12 DIAGNOSIS — O09292 Supervision of pregnancy with other poor reproductive or obstetric history, second trimester: Secondary | ICD-10-CM

## 2022-10-12 DIAGNOSIS — Z758 Other problems related to medical facilities and other health care: Secondary | ICD-10-CM

## 2022-10-12 DIAGNOSIS — Z603 Acculturation difficulty: Secondary | ICD-10-CM

## 2022-10-12 DIAGNOSIS — O10912 Unspecified pre-existing hypertension complicating pregnancy, second trimester: Secondary | ICD-10-CM

## 2022-10-12 DIAGNOSIS — O10919 Unspecified pre-existing hypertension complicating pregnancy, unspecified trimester: Secondary | ICD-10-CM

## 2022-10-12 DIAGNOSIS — O0990 Supervision of high risk pregnancy, unspecified, unspecified trimester: Secondary | ICD-10-CM

## 2022-10-12 DIAGNOSIS — O09299 Supervision of pregnancy with other poor reproductive or obstetric history, unspecified trimester: Secondary | ICD-10-CM | POA: Insufficient documentation

## 2022-10-12 NOTE — Progress Notes (Signed)
   PRENATAL VISIT NOTE  Subjective:  Renee Carson is a 42 y.o. G6P5005 at [redacted]w[redacted]d being seen today for ongoing prenatal care.  She is currently monitored for the following issues for this high-risk pregnancy and has Language barrier; ASCUS of cervix with negative high risk HPV; Chronic hypertension during pregnancy; Supervision of high risk pregnancy, antepartum; and History of postpartum hemorrhage, currently pregnant on their problem list.  Patient reports no complaints.  Contractions: Not present. Vag. Bleeding: None.  Movement: Present. Denies leaking of fluid.   The following portions of the patient's history were reviewed and updated as appropriate: allergies, current medications, past family history, past medical history, past social history, past surgical history and problem list.   Objective:   Vitals:   10/12/22 1419  BP: 134/86  Pulse: 98  Weight: 131 lb 14.4 oz (59.8 kg)    Fetal Status: Fetal Heart Rate (bpm): 138 Fundal Height: 25 cm Movement: Present     General:  Alert, oriented and cooperative. Patient is in no acute distress.  Skin: Skin is warm and dry. No rash noted.   Cardiovascular: Normal heart rate noted  Respiratory: Normal respiratory effort, no problems with respiration noted  Abdomen: Soft, gravid, appropriate for gestational age.  Pain/Pressure: Present     Pelvic: Cervical exam deferred        Extremities: Normal range of motion.     Mental Status: Normal mood and affect. Normal behavior. Normal judgment and thought content.   Assessment and Plan:  Pregnancy: G6P5005 at [redacted]w[redacted]d  1. Supervision of high risk pregnancy, antepartum UTD Vigorous movement Has [place] scheduled in July Labs are UTD Next appt will need labs-- labs placed as future  2. Chronic hypertension during pregnancy, antepartum BP WNL Baseline labs WNL-- normal UPC and CMP from 6/11  3. Language barrier In person interpreter utilized   Preterm labor symptoms and general obstetric  precautions including but not limited to vaginal bleeding, contractions, leaking of fluid and fetal movement were reviewed in detail with the patient. Please refer to After Visit Summary for other counseling recommendations.   Return in about 4 weeks (around 11/09/2022) for Routine prenatal care, MD or APP.  Future Appointments  Date Time Provider Department Center  11/05/2022 10:15 AM [facility] NURSE [facility]  11/05/2022 10:30 AM [facility] US3 [facility]    Federico Flake, MD

## 2022-10-13 LAB — HORIZON CUSTOM: REPORT SUMMARY: POSITIVE — AB

## 2022-10-15 ENCOUNTER — Encounter: Payer: Self-pay | Admitting: Family Medicine

## 2022-10-15 DIAGNOSIS — Z641 Problems related to multiparity: Secondary | ICD-10-CM | POA: Insufficient documentation

## 2022-10-15 DIAGNOSIS — D563 Thalassemia minor: Secondary | ICD-10-CM | POA: Insufficient documentation

## 2022-10-18 ENCOUNTER — Telehealth: Payer: Self-pay

## 2022-10-18 LAB — ANEMIA PROFILE B
Basophils Absolute: 0 10*3/uL (ref 0.0–0.2)
Basos: 1 %
EOS (ABSOLUTE): 0.4 10*3/uL (ref 0.0–0.4)
Eos: 5 %
Ferritin: 8 ng/mL — ABNORMAL LOW (ref 15–150)
Folate: 17.2 ng/mL (ref >3.0)
Hematocrit: 35.2 % (ref 34.0–46.6)
Hemoglobin: 11.3 g/dL (ref 11.1–15.9)
Immature Grans (Abs): 0 10*3/uL (ref 0.0–0.1)
Immature Granulocytes: 0 %
Iron Saturation: 6 % — CL (ref 15–55)
Iron: 36 ug/dL (ref 27–159)
Lymphocytes Absolute: 1.4 10*3/uL (ref 0.7–3.1)
Lymphs: 21 %
MCH: 22.1 pg — ABNORMAL LOW (ref 26.6–33.0)
MCHC: 32.1 g/dL (ref 31.5–35.7)
MCV: 69 fL — ABNORMAL LOW (ref 79–97)
Monocytes Absolute: 0.4 10*3/uL (ref 0.1–0.9)
Monocytes: 6 %
Neutrophils Absolute: 4.4 10*3/uL (ref 1.4–7.0)
Neutrophils: 67 %
Platelets: 230 10*3/uL (ref 150–450)
RBC: 5.11 x10E6/uL (ref 3.77–5.28)
RDW: 14.1 % (ref 11.7–15.4)
Total Iron Binding Capacity: 600 ug/dL (ref 250–450)
UIBC: 564 ug/dL — ABNORMAL HIGH (ref 131–425)
Vitamin B-12: 599 pg/mL (ref 232–1245)
WBC: 6.6 10*3/uL (ref 3.4–10.8)

## 2022-10-18 LAB — PANORAMA PRENATAL TEST FULL PANEL:PANORAMA TEST PLUS 5 ADDITIONAL MICRODELETIONS

## 2022-10-18 LAB — SPECIMEN STATUS REPORT

## 2022-10-18 NOTE — Telephone Encounter (Addendum)
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 Need partner kit  Attempted to contact pt with [employer] was informed that they did not offer that interpreter.

## 2022-10-20 ENCOUNTER — Encounter: Payer: Self-pay | Admitting: Family Medicine

## 2022-10-20 DIAGNOSIS — O289 Unspecified abnormal findings on antenatal screening of mother: Secondary | ICD-10-CM | POA: Insufficient documentation

## 2022-10-29 NOTE — Telephone Encounter (Signed)
Attempted to contact pt several times.  VM left.  Note placed in appt for pt to be notified of results.   Leonette Nutting  10/29/22

## 2022-11-01 ENCOUNTER — Encounter: Payer: Self-pay | Admitting: Family Medicine

## 2022-11-01 DIAGNOSIS — O09529 Supervision of elderly multigravida, unspecified trimester: Secondary | ICD-10-CM | POA: Insufficient documentation

## 2022-11-02 NOTE — Progress Notes (Signed)
Attempted to contact several times without an available interpreter.  Will notify patient results at next appt.      Renee Carson

## 2022-11-05 ENCOUNTER — Ambulatory Visit: Payer: 59 | Attending: Family Medicine

## 2022-11-05 ENCOUNTER — Encounter: Payer: Self-pay | Admitting: *Deleted

## 2022-11-05 ENCOUNTER — Other Ambulatory Visit: Payer: Self-pay | Admitting: Family Medicine

## 2022-11-05 ENCOUNTER — Other Ambulatory Visit: Payer: Self-pay | Admitting: *Deleted

## 2022-11-05 ENCOUNTER — Ambulatory Visit: Payer: 59 | Admitting: *Deleted

## 2022-11-05 VITALS — BP 139/88 | HR 93

## 2022-11-05 DIAGNOSIS — O09299 Supervision of pregnancy with other poor reproductive or obstetric history, unspecified trimester: Secondary | ICD-10-CM | POA: Diagnosis present

## 2022-11-05 DIAGNOSIS — O10013 Pre-existing essential hypertension complicating pregnancy, third trimester: Secondary | ICD-10-CM | POA: Diagnosis not present

## 2022-11-05 DIAGNOSIS — D563 Thalassemia minor: Secondary | ICD-10-CM | POA: Diagnosis present

## 2022-11-05 DIAGNOSIS — O285 Abnormal chromosomal and genetic finding on antenatal screening of mother: Secondary | ICD-10-CM

## 2022-11-05 DIAGNOSIS — O0933 Supervision of pregnancy with insufficient antenatal care, third trimester: Secondary | ICD-10-CM

## 2022-11-05 DIAGNOSIS — O0990 Supervision of high risk pregnancy, unspecified, unspecified trimester: Secondary | ICD-10-CM

## 2022-11-05 DIAGNOSIS — O289 Unspecified abnormal findings on antenatal screening of mother: Secondary | ICD-10-CM | POA: Insufficient documentation

## 2022-11-05 DIAGNOSIS — O26843 Uterine size-date discrepancy, third trimester: Secondary | ICD-10-CM

## 2022-11-05 DIAGNOSIS — O280 Abnormal hematological finding on antenatal screening of mother: Secondary | ICD-10-CM

## 2022-11-05 DIAGNOSIS — O09523 Supervision of elderly multigravida, third trimester: Secondary | ICD-10-CM | POA: Insufficient documentation

## 2022-11-05 DIAGNOSIS — O10913 Unspecified pre-existing hypertension complicating pregnancy, third trimester: Secondary | ICD-10-CM

## 2022-11-05 DIAGNOSIS — O99013 Anemia complicating pregnancy, third trimester: Secondary | ICD-10-CM | POA: Diagnosis not present

## 2022-11-05 DIAGNOSIS — Z3A33 33 weeks gestation of pregnancy: Secondary | ICD-10-CM

## 2022-11-05 DIAGNOSIS — O0943 Supervision of pregnancy with grand multiparity, third trimester: Secondary | ICD-10-CM

## 2022-11-11 ENCOUNTER — Ambulatory Visit: Payer: 59 | Attending: Maternal & Fetal Medicine | Admitting: Obstetrics and Gynecology

## 2022-11-11 DIAGNOSIS — Z3A33 33 weeks gestation of pregnancy: Secondary | ICD-10-CM | POA: Diagnosis not present

## 2022-11-11 DIAGNOSIS — O285 Abnormal chromosomal and genetic finding on antenatal screening of mother: Secondary | ICD-10-CM

## 2022-11-11 DIAGNOSIS — O09523 Supervision of elderly multigravida, third trimester: Secondary | ICD-10-CM

## 2022-11-11 NOTE — Progress Notes (Signed)
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  The patient was present at this visit with her daughter. An in person [place] interpreter was utilized for this visit.  Atypical Panorama results. Renee Carson had Panorama noninvasive prenatal screening (NIPS) through [place] that demonstrated an "atypical finding" outside the scope of the test. Per the laboratory report, there is suspicion of an abnormality involving chromosome 21 originating from the pregnancy rather than Renee Carson herself. We reviewed that NIPS analyzes cell-free DNA from the placenta found in the maternal bloodstream during pregnancy. Based on this, we discussed that there are several possibilities that could warrant her atypical NIPS result, including the fetus having a chromosome abnormality in some of all of the cells, confined placental mosaicism (a result representative of the placenta only rather than the fetus) for a chromosomal abnormality, and normal variation.    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 These include deletions or duplications of chromosome 21 material, a chromosomal rearrangement involving chromosome 21 and possibly another chromosome, or trisomy 21 mosaicism (some cells in the body having trisomy 21, with other cells in the body being chromosomally normal). We discussed that it can be difficult to predict which features an individual with one of these conditions may have during the prenatal period. Some deletions, duplications, and rearrangements are benign, whereas others may be associated  with problems with health or development. For trisomy 55 mosaicism specifically, individuals may display widely variable clinical features. It is impossible to predict the percentage of cells with trisomy 21 that are present in some organs, such as the brain or heart, kidneys which makes prediction of features challenging. Some individuals with trisomy 21 mosaicism have similar features to those with full trisomy 44 (Down syndrome). Other individuals with trisomy 45 mosaicism may have very mild features or be phenotypically normal. In general, individuals with mosaicism who have a high frequency of trisomy 21 cells tend to have more clinical traits associated with Down syndrome than individuals with mosaicism who have lower proportions of trisomic cells; however, this relationship has not been universally observed for all traits (Papavassiliou et al., 2014).    We also discussed possible explanations for Ms. Suminski's NIPS result that do not relate to the fetus. Since NIPS is assessing placental rather than fetal cells, it is possible that the placenta may have an abnormality involving chromosome 21 while the fetus may be chromosomally normal. This is a phenomenon known as confined placental mosaicism.    In order to get further information about the fetus's chromosomes, Renee Carson was counseled that she could pursue diagnostic testing through amniocentesis. We reviewed the risks, benefits and limitations of this test. The patient is currently [redacted]w[redacted]d gestation.  Advanced Maternal Age. Genetic counseling also reviewed with Renee Carson that as a woman ages, the risk for certain chromosomal conditions, such as Trisomy 80 (Down syndrome), Trisomy 13, and Trisomy 18 increases. These conditions often are not inherited, but instead occur due to an error in chromosomal division during the formation of sperm and egg cells in a process called nondisjunction. With delivery at 42 years, Renee Carson's age-related risk  to have a liveborn baby  with Down syndrome is 1/45 and risk to have a pregnancy with any chromosome condition is 1/39. We briefly reviewed features associated with Down syndrome, Trisomy 13, and Trisomy 56. We also discussed the various screening and diagnostic testing options available to Renee Carson in pregnancy.    Maternal Hemoglobin E Trait. Genetic counseling reviewed with Renee Carson that hemoglobin is the part of the red blood cells that carries oxygen to tissues throughout the body. 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 Carriers of Hemoglobin E trait usually do not have symptoms, although some will have very mild anemia that does not typically need treatment. Given Renee Carson's carrier status, we discussed the natural history and autosomal recessive inheritance pattern associated with Beta-Hemoglobinopathies. We additionally reviewed that Renee Carson would have an increased risk to have children affected with a Beta-Hemoglobinopathy if her husband is also found to be a carrier of a Beta-Hemoglobinopathy.  If her husband is found to also be a carrier for Hemoglobin E trait (Hb A/E), there would be a 25% risk for their offspring together to be affected with Hemoglobin E Disease (Hb E/E). People with Hemoglobin E Disease usually do not have symptoms other than sometimes having very mild anemia, and, more rarely, having a slightly enlarged spleen. People with Hemoglobin E Disease rarely need treatment. If Renee Carson is found to be a carrier for Sickle Cell Disease (Hb A/S), there would be a 25% risk for their offspring together to be affected with Hemoglobin SE Disease (Hb S/E), which is a mild form of Sickle Cell Disease. In Hemoglobin SE Disease, red blood cells have an abnormal sickle shape which leads to mild to moderate anemia. Symptoms are usually mild in childhood but may become more serious starting in adulthood. Some  people with Hemoglobin SE Disease have episodes of pain when sickled red blood cells get stuck in small blood vessels; this occurs more often during pregnancy. Other symptoms of Hemoglobin SE Disease may include jaundice, enlarged spleen, increased infections, and/or damage to other organs. If Renee Carson is found to be a carrier of Beta-Thalassemia, there would be a 25% risk for their offspring together to be affected with Hemoglobin E/Beta Thalassemia (Hb E/?+ or Hb E/?). Individuals with Hemoglobin E/Beta Thalassemia typically have moderate to severe anemia that usually requires ongoing medical care which sometimes includes repeated blood transfusions. Additional symptoms may include slow growth and development, jaundice, bone changes, and an enlarged spleen, liver, and/or heart.  Given Renee Carson's carrier screening result, genetic counseling recommended screening her husband for Beta Hemoglobinopathies. If both members of a couple are known to be carriers, diagnostic testing is available to determine if the pregnancy is affected. If additional testing on the father of the baby or the pregnancy are not desired, Beta Hemoglobinopathy testing can be completed after birth and is included in Renee Carson's Newborn Screening Program.   The remainder of her carrier screening was negative for alpha thalassemia (HBA1/HBA2), cystic fibrosis and spinal muscular atrophy.  Family history and pregnancy history: We obtained a detailed family history and pregnancy history.  The family history was reported to be unremarkable for birth defects, intellectual delays, recurrent pregnancy loss or known chromosome abnormalities. Renee Carson stated that this is her sixth pregnancy. She and her husband have five healthy children, ages 38 years to 5 years. She reported no complications or exposure in this pregnancy  to medications, alcohol, tobacco or recreational drugs. Of note, the patient has a very low MCV (69), much lower than would  typically be expected from hemoglobin E trait.  Her OB has evaluated her for iron deficiency and is treating her accordingly.  If needed, a hematology consultation could be considered.  Plan of Care: Renee Carson declined amniocentesis for chromosome analysis. She also declined carrier testing for her husband for hemoglobinopathies. Renee Carson [place] out of state and it would be difficult to get this testing performed.  In addition, she prefers to wait until delivery. We recommend pediatric genetics evaluation at birth due to the risk for aneuploidy and will include this patient on the [facility] list.  Renee Carson was encouraged to call with questions or concerns.  We can be contacted at [phone].  Cherly Anderson, MS, CGC

## 2022-11-12 ENCOUNTER — Other Ambulatory Visit: Payer: 59

## 2022-11-12 ENCOUNTER — Encounter: Payer: Self-pay | Admitting: Obstetrics & Gynecology

## 2022-11-12 ENCOUNTER — Inpatient Hospital Stay (HOSPITAL_COMMUNITY)
Admission: AD | Admit: 2022-11-12 | Discharge: 2022-11-12 | Disposition: A | Discharge status: Home / Self Care | Payer: 59 | Attending: Obstetrics and Gynecology | Admitting: Obstetrics and Gynecology

## 2022-11-12 ENCOUNTER — Encounter (HOSPITAL_COMMUNITY): Payer: Self-pay | Admitting: Obstetrics and Gynecology

## 2022-11-12 ENCOUNTER — Ambulatory Visit: Payer: 59

## 2022-11-12 ENCOUNTER — Ambulatory Visit (INDEPENDENT_AMBULATORY_CARE_PROVIDER_SITE_OTHER): Payer: 59 | Admitting: Obstetrics & Gynecology

## 2022-11-12 VITALS — BP 172/101 | HR 85 | Wt 130.7 lb

## 2022-11-12 DIAGNOSIS — O09293 Supervision of pregnancy with other poor reproductive or obstetric history, third trimester: Secondary | ICD-10-CM | POA: Diagnosis not present

## 2022-11-12 DIAGNOSIS — I10 Essential (primary) hypertension: Secondary | ICD-10-CM | POA: Diagnosis not present

## 2022-11-12 DIAGNOSIS — O10013 Pre-existing essential hypertension complicating pregnancy, third trimester: Secondary | ICD-10-CM | POA: Insufficient documentation

## 2022-11-12 DIAGNOSIS — O289 Unspecified abnormal findings on antenatal screening of mother: Secondary | ICD-10-CM

## 2022-11-12 DIAGNOSIS — O09299 Supervision of pregnancy with other poor reproductive or obstetric history, unspecified trimester: Secondary | ICD-10-CM

## 2022-11-12 DIAGNOSIS — D563 Thalassemia minor: Secondary | ICD-10-CM | POA: Diagnosis not present

## 2022-11-12 DIAGNOSIS — O0990 Supervision of high risk pregnancy, unspecified, unspecified trimester: Secondary | ICD-10-CM

## 2022-11-12 DIAGNOSIS — Z3A34 34 weeks gestation of pregnancy: Secondary | ICD-10-CM | POA: Diagnosis not present

## 2022-11-12 DIAGNOSIS — O10919 Unspecified pre-existing hypertension complicating pregnancy, unspecified trimester: Secondary | ICD-10-CM

## 2022-11-12 DIAGNOSIS — O99013 Anemia complicating pregnancy, third trimester: Secondary | ICD-10-CM | POA: Insufficient documentation

## 2022-11-12 DIAGNOSIS — Z23 Encounter for immunization: Secondary | ICD-10-CM

## 2022-11-12 DIAGNOSIS — O0993 Supervision of high risk pregnancy, unspecified, third trimester: Secondary | ICD-10-CM

## 2022-11-12 DIAGNOSIS — Z3689 Encounter for other specified antenatal screening: Secondary | ICD-10-CM

## 2022-11-12 DIAGNOSIS — O09523 Supervision of elderly multigravida, third trimester: Secondary | ICD-10-CM | POA: Diagnosis not present

## 2022-11-12 DIAGNOSIS — O10913 Unspecified pre-existing hypertension complicating pregnancy, third trimester: Secondary | ICD-10-CM

## 2022-11-12 DIAGNOSIS — Z641 Problems related to multiparity: Secondary | ICD-10-CM

## 2022-11-12 LAB — COMPREHENSIVE METABOLIC PANEL
ALT: 12 U/L (ref 0–44)
AST: 24 U/L (ref 15–41)
Albumin: 3 g/dL — ABNORMAL LOW (ref 3.5–5.0)
Alkaline Phosphatase: 133 U/L — ABNORMAL HIGH (ref 38–126)
Anion gap: 9 (ref 5–15)
BUN: 6 mg/dL (ref 6–20)
CO2: 19 mmol/L — ABNORMAL LOW (ref 22–32)
Calcium: 8.5 mg/dL — ABNORMAL LOW (ref 8.9–10.3)
Chloride: 107 mmol/L (ref 98–111)
Creatinine, Ser: 0.58 mg/dL (ref 0.44–1.00)
GFR, Estimated: >60 mL/min (ref >60)
Glucose, Bld: 128 mg/dL — ABNORMAL HIGH (ref 70–99)
Potassium: 3.3 mmol/L — ABNORMAL LOW (ref 3.5–5.1)
Sodium: 135 mmol/L (ref 135–145)
Total Bilirubin: 0.1 mg/dL — ABNORMAL LOW (ref 0.3–1.2)
Total Protein: 6.6 g/dL (ref 6.5–8.1)

## 2022-11-12 LAB — CBC
HCT: 34.1 % — ABNORMAL LOW (ref 36.0–46.0)
Hemoglobin: 11.1 g/dL — ABNORMAL LOW (ref 12.0–15.0)
MCH: 21.4 pg — ABNORMAL LOW (ref 26.0–34.0)
MCHC: 32.6 g/dL (ref 30.0–36.0)
MCV: 65.8 fL — ABNORMAL LOW (ref 80.0–100.0)
Platelets: 157 10*3/uL (ref 150–400)
RBC: 5.18 MIL/uL — ABNORMAL HIGH (ref 3.87–5.11)
RDW: 16.3 % — ABNORMAL HIGH (ref 11.5–15.5)
WBC: 6.6 10*3/uL (ref 4.0–10.5)
nRBC: 0 % (ref 0.0–0.2)

## 2022-11-12 LAB — PROTEIN / CREATININE RATIO, URINE
Creatinine, Urine: 48 mg/dL
Protein Creatinine Ratio: 0.13 mg/mg{Cre} (ref 0.00–0.15)
Total Protein, Urine: 6 mg/dL

## 2022-11-12 NOTE — MAU Note (Signed)
.  Renee Carson is a 42 y.o. at [redacted]w[redacted]d here in MAU reporting: sent from office for elevated b/p. Pt deneis headache or visual changes. Good fetal movement reported.  LMP:  Onset of complaint: today Pain score: 0 There were no vitals filed for this visit.   FHT:143 Lab orders placed from triage:

## 2022-11-12 NOTE — Progress Notes (Signed)
PRENATAL VISIT NOTE  Subjective:  Renee Carson is a 42 y.o. G6P5005 at [redacted]w[redacted]d being seen today for ongoing prenatal care. [place] interpreter present for encounter.  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  Patient reports no complaints.  Contractions: Not present. Vag. Bleeding: None.  Movement: Present. Denies leaking of fluid.  Patient denies any headaches, visual symptoms, RUQ/epigastric pain or other concerning symptoms.   The following portions of the patient's history were reviewed and updated as appropriate: allergies, current medications, past family history, past medical history, past social history, past surgical history and problem list.   Objective:   Vitals:   11/12/22 0953 11/12/22 0958  BP: (!) 158/96 (!) 172/101  Pulse: 85   Weight: 130 lb 11.2 oz (59.3 kg)     Fetal Status: Fetal Heart Rate (bpm): 131   Movement: Present     General:  Alert, oriented and cooperative. Patient is in no acute distress.  Skin: Skin is warm and dry. No rash noted.   Cardiovascular: Normal heart rate noted  Respiratory: Normal respiratory effort, no problems with respiration noted  Abdomen: Soft, gravid, appropriate for gestational age.  Pain/Pressure: Absent     Pelvic: Cervical exam deferred        Extremities: Normal range of motion.  Edema: None  Mental Status: Normal mood and affect. Normal behavior. Normal judgment and thought content.   Imaging: [place] MFM OB DETAIL +14 WK  Result Date: 11/05/2022 ----------------------------------------------------------------------  OBSTETRICS REPORT                       (Signed Final  11/05/2022 12:03 pm) ---------------------------------------------------------------------- Patient Info  ID #:       [id number]                          D.O.B.:  [birth year] (42 yrs)  Name:       Renee Carson                     Visit Date: 11/05/2022 10:35 am ---------------------------------------------------------------------- Performed By  Attending:        Braxton Feathers DO       Ref. Address:     [address]                                                             [place], [place]                                                             [address]  Performed By:     Tommie Raymond BS,       Location[facility], RVT                                [facility]  Referred By:      [facility] ---------------------------------------------------------------------- Orders  #  Description                           Code        Ordered By  1  [place] MFM OB DETAIL +14 WK               76811.01    KIMBERLY [place]  2  [place] MFM FETAL BPP WO NON               76819.01    [facility] [place]     STRESS ----------------------------------------------------------------------  #  Order #                     Accession #                Episode #  1  [id number]                   [id number]                 [id number]  2  [id number]                   [id number]                 [id number] ---------------------------------------------------------------------- Indications  [redacted] weeks gestation of pregnancy                Z3A.72  Advanced maternal age multigravida 43+,        O73.523  third trimester (age 53)  Late to prenatal care, third trimester         O09.33  Abnormal biochemical screen (quad) for         O28.9  Trisomy 21  Genetic carrier (Beta Thal)                    Z14.8  Hypertension - Chronic/Pre-existing            O10.019  Grand  multiparity, antepartum                  O09.40  Anemia during pregnancy in third trimester     O99.013  Uterine size-date discrepancy, third trimester O26.843  Encounter for antenatal screening for          Z36.3  malformations ---------------------------------------------------------------------- Fetal  Evaluation  Num Of Fetuses:         1  Fetal Heart Rate(bpm):  132  Cardiac Activity:       Observed  Presentation:           Cephalic  Placenta:               Posterior  P. Cord Insertion:      Visualized  Amniotic Fluid  AFI FV:      Within normal limits  AFI Sum(cm)     %Tile       Largest Pocket(cm)  13.5            44          4.23  RUQ(cm)       RLQ(cm)       LUQ(cm)        LLQ(cm)  4.23          3.04          4.12           2.11 ---------------------------------------------------------------------- Biophysical Evaluation  Amniotic F.V:   Pocket => 2 cm             F. Tone:        Observed  F. Movement:    Observed                   Score:          8/8  F. Breathing:   Observed ---------------------------------------------------------------------- Biometry  BPD:      80.9  mm     G. Age:  32w 3d         29  %    CI:        76.34   %    70 - 86                                                          FL/HC:      20.6   %    19.9 - 21.5  HC:      293.4  mm     G. Age:  32w 3d          7  %    HC/AC:      0.93        0.96 - 1.11  AC:      315.4  mm     G. Age:  35w 3d         97  %    FL/BPD:     74.8   %    71 - 87  FL:       60.5  mm     G. Age:  31w 3d          8  %    FL/AC:      19.2   %    20 - 24  HUM:      54.2  mm     G. Age:  31w 4d         30  %  CER:      43.6  mm     G. Age:  34w 1d         58  %  LV:  3.8  mm  CM:        8.5  mm  Est. FW:    2272  gm           5 lb     66  % ---------------------------------------------------------------------- OB History  Blood Type:   A+  Gravidity:    6         Term:   5  Living:       5 ----------------------------------------------------------------------  Gestational Age  LMP:           29w 0d        Date:  04/16/22                 EDD:   01/21/23  U/S Today:     33w 0d                                        EDD:   12/24/22  Best:          33w 0d     Det. By:  U/S (11/05/22)           EDD:   12/24/22 ---------------------------------------------------------------------- Anatomy  Cranium:               Appears normal         LVOT:                   Appears normal  Cavum:                 Appears normal         Aortic Arch:            Appears normal  Ventricles:            Appears normal         Ductal Arch:            Appears normal  Choroid Plexus:        Appears normal         Diaphragm:              Appears normal  Cerebellum:            Appears normal         Stomach:                Appears normal, left                                                                        sided  Posterior Fossa:       Appears normal         Abdomen:                Appears normal  Nuchal Fold:           Not applicable (>20    Abdominal Wall:         Appears nml (cord                         wks GA)  insert, abd wall)  Face:                  Appears normal         Cord Vessels:           Appears normal (3                         (orbits and profile)                           vessel cord)  Lips:                  Appears normal         Kidneys:                Appear normal  Palate:                Not well visualized    Bladder:                Appears normal  Thoracic:              Appears normal         Spine:                  Limited views                                                                        appear normal  Heart:                 Appears normal         Upper Extremities:      Visualized                         (4CH, axis, and                         situs)  RVOT:                  Appears normal         Lower Extremities:      Appears normal  Other:  Female gender, Nasal bone, lenses, maxilla, mandible and falx          visualized.  Heels/feet and hands digits visualized. VC, 3VV and 3VTV          visualized. Technically difficult due to advanced gestational age. ---------------------------------------------------------------------- Cervix Uterus Adnexa  Cervix  Length:            3.1  cm.  Normal appearance by transabdominal scan  Uterus  No abnormality visualized.  Right Ovary  Within normal limits.  Left Ovary  Within normal limits.  Cul De Sac  No free fluid seen.  Adnexa  No abnormality visualized ---------------------------------------------------------------------- Comments  The patient is here for an anatomy ultrasound. 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  Sonographic findings  Single intrauterine pregnancy at 33w 0d.  Fetal cardiac activity:  Observed and appears normal.  Presentation: Cephalic.  The anatomic structures that were well seen appear normal  without evidence of soft markers. Due to poor acoustic  windows some structures remain suboptimally visualized.  Fetal biometry shows the estimated fetal weight at the 66  percentile.  Amniotic fluid:  MVP: 4.23 cm.  Placenta: Posterior.  Adnexa: No abnormality visualized.  Cervical length: 3.1 cm.  BPP 8/8.  Recommendations  - EDD should be 12/24/2022 based on  U/S (11/05/22) due to  the LMP measuring off by 4 weeks  -Weekly antenatal testing with serial growth ultrasounds due  to advanced maternal age and chronic hypertension  -I discussed the NIPT findings with the patient and she  declines amniocentesis but would like genetic counseling  There are limitations of prenatal ultrasound such as the  inability to detect certain abnormalities due to poor  visualization. Various factors such as fetal position,  gestational age and maternal body habitus may increase the  difficulty in visualizing the fetal anatomy.  ----------------------------------------------------------------------                  Braxton Feathers, DO Electronically Signed Final Report   11/05/2022 12:03 pm ----------------------------------------------------------------------   [place] MFM FETAL BPP WO NON STRESS  Result Date: 11/05/2022 ----------------------------------------------------------------------  OBSTETRICS REPORT                       (Signed Final 11/05/2022 12:03 pm) ---------------------------------------------------------------------- Patient Info  ID #:       [id number]                          D.O.B.:  1981/04/08 (42 yrs)  Name:       Renee Carson                     Visit Date: 11/05/2022 10:35 am ---------------------------------------------------------------------- Performed By  Attending:        Braxton Feathers DO       Ref. Address:     [address]                                                             [place], [place]                                                             [address]  Performed By:     Tommie Raymond BS,       Location[facility]                    RD[facility], RVT                                [facility]  Referred By:      [facility] ---------------------------------------------------------------------- Orders  #  Description                           Code        Ordered By  1  [place] MFM OB DETAIL +14 WK               [id number]    Lyndel Safe  2  [place] MFM FETAL BPP WO NON               [id number]    [facility] [place]     STRESS ----------------------------------------------------------------------  #  Order #                     Accession #                Episode #  1  [id number]                   [id number]                 [id number]  2  [id number]                   [id number]                 [id number]  ---------------------------------------------------------------------- Indications  [redacted] weeks gestation of pregnancy                Z3A.22  Advanced maternal age multigravida 30+,        O76.523  third trimester (age 37)  Late to prenatal care, third trimester         O09.33  Abnormal biochemical screen (quad) for         O28.9  Trisomy 21  Genetic carrier (Beta Thal)                    Z14.8  Hypertension - Chronic/Pre-existing            O10.019  Grand multiparity, antepartum                  O09.40  Anemia during pregnancy in third trimester     O99.013  Uterine size-date discrepancy, third trimester O26.843  Encounter for antenatal screening for          Z36.3  malformations ---------------------------------------------------------------------- Fetal Evaluation  Num Of Fetuses:         1  Fetal Heart Rate(bpm):  132  Cardiac Activity:       Observed  Presentation:           Cephalic  Placenta:               Posterior  P. Cord Insertion:      Visualized  Amniotic Fluid  AFI FV:      Within normal limits  AFI Sum(cm)     %Tile  Largest Pocket(cm)  13.5            44          4.23  RUQ(cm)       RLQ(cm)       LUQ(cm)        LLQ(cm)  4.23          3.04          4.12           2.11 ---------------------------------------------------------------------- Biophysical Evaluation  Amniotic F.V:   Pocket => 2 cm             F. Tone:        Observed  F. Movement:    Observed                   Score:          8/8  F. Breathing:   Observed ---------------------------------------------------------------------- Biometry  BPD:      80.9  mm     G. Age:  32w 3d         29  %    CI:        76.34   %    70 - 86                                                          FL/HC:      20.6   %    19.9 - 21.5  HC:      293.4  mm     G. Age:  32w 3d          7  %    HC/AC:      0.93        0.96 - 1.11  AC:      315.4  mm     G. Age:  35w 3d         97  %    FL/BPD:     74.8   %    71 - 87  FL:       60.5  mm     G. Age:  31w 3d           8  %    FL/AC:      19.2   %    20 - 24  HUM:      54.2  mm     G. Age:  31w 4d         30  %  CER:      43.6  mm     G. Age:  34w 1d         58  %  LV:        3.8  mm  CM:        8.5  mm  Est. FW:    2272  gm           5 lb     66  % ---------------------------------------------------------------------- OB History  Blood Type:   A+  Gravidity:    6         Term:   5  Living:       5 ---------------------------------------------------------------------- Gestational Age  LMP:           29w  0d        Date:  04/16/22                 EDD:   01/21/23  U/S Today:     33w 0d                                        EDD:   12/24/22  Best:          33w 0d     Det. By:  U/S (11/05/22)           EDD:   12/24/22 ---------------------------------------------------------------------- Anatomy  Cranium:               Appears normal         LVOT:                   Appears normal  Cavum:                 Appears normal         Aortic Arch:            Appears normal  Ventricles:            Appears normal         Ductal Arch:            Appears normal  Choroid Plexus:        Appears normal         Diaphragm:              Appears normal  Cerebellum:            Appears normal         Stomach:                Appears normal, left                                                                        sided  Posterior Fossa:       Appears normal         Abdomen:                Appears normal  Nuchal Fold:           Not applicable (>20    Abdominal Wall:         Appears nml (cord                         wks GA)                                        insert, abd wall)  Face:                  Appears normal         Cord Vessels:           Appears normal (3                         (  orbits and profile)                           vessel cord)  Lips:                  Appears normal         Kidneys:                Appear normal  Palate:                Not well visualized    Bladder:                Appears normal  Thoracic:              Appears normal          Spine:                  Limited views                                                                        appear normal  Heart:                 Appears normal         Upper Extremities:      Visualized                         (4CH, axis, and                         situs)  RVOT:                  Appears normal         Lower Extremities:      Appears normal  Other:  Female gender, Nasal bone, lenses, maxilla, mandible and falx          visualized. Heels/feet and hands digits visualized. VC, 3VV and 3VTV          visualized. Technically difficult due to advanced gestational age. ---------------------------------------------------------------------- Cervix Uterus Adnexa  Cervix  Length:            3.1  cm.  Normal appearance by transabdominal scan  Uterus  No abnormality visualized.  Right Ovary  Within normal limits.  Left Ovary  Within normal limits.  Cul De Sac  No free fluid seen.  Adnexa  No abnormality visualized ---------------------------------------------------------------------- Comments  The patient is here for an anatomy ultrasound. 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  Sonographic findings  Single intrauterine pregnancy at 33w 0d.  Fetal cardiac activity:  Observed and appears normal.  Presentation: Cephalic.  The anatomic structures that were well seen appear normal  without evidence of soft markers. Due to poor acoustic  windows some structures remain suboptimally visualized.  Fetal biometry shows the estimated fetal weight at the 66  percentile.  Amniotic fluid:  MVP: 4.23 cm.  Placenta: Posterior.  Adnexa: No abnormality visualized.  Cervical length: 3.1 cm.  BPP 8/8.  Recommendations  - EDD should be 12/24/2022 based on  U/S (11/05/22) due to  the LMP measuring off by 4 weeks  -Weekly antenatal testing with serial growth ultrasounds due  to advanced  maternal age and chronic hypertension  -I discussed the NIPT findings with the patient and she  declines amniocentesis but would like genetic counseling  There are limitations of prenatal ultrasound such as the  inability to detect certain abnormalities due to poor  visualization. Various factors such as fetal position,  gestational age and maternal body habitus may increase the  difficulty in visualizing the fetal anatomy. ----------------------------------------------------------------------                  Braxton Feathers, DO Electronically Signed Final Report   11/05/2022 12:03 pm ----------------------------------------------------------------------    Assessment and Plan:  Pregnancy: [id number] at [redacted]w[redacted]d 1. Chronic hypertension during pregnancy Sent to MAU for further evaluation given severe range BP.  2. 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 Multigravida of advanced maternal age in third trimester Declined amniocentesis  4. Grand multiparity High risk of PPH, precautions to be taken intra and postpartum.   5. Need for Tdap vaccination - Tdap vaccine greater than or equal to 7yo IM given today  6. [redacted] weeks gestation of pregnancy 7. Supervision of high risk pregnancy, antepartum No other concerns. Preterm labor symptoms and general obstetric precautions including but not limited to vaginal bleeding, contractions, leaking of fluid and fetal movement were reviewed in detail with the patient. Please refer to After Visit Summary for other counseling recommendations.   Return in about 2 weeks (around 11/26/2022) for Pelvic cultures, OFFICE OB VISIT (MD only).  Future Appointments  Date Time Provider Department Center  11/12/2022  1:00 PM [facility] NURSE [facility]  11/12/2022  1:15 PM [facility] NST [facility]  11/19/2022  9:30 AM [facility] NURSE [facility]  11/19/2022  9:45 AM [facility] NST [facility]  11/24/2022  2:35 PM Warden Fillers, MD [facility]  11/26/2022  1:00 PM [facility] NURSE [facility]   11/26/2022  1:15 PM [facility] NST [facility]  12/03/2022 11:15 AM [facility] NURSE [facility]  12/03/2022 11:30 AM [facility] US2 [facility]    Jaynie Collins, MD

## 2022-11-12 NOTE — MAU Provider Note (Signed)
Chief Complaint:  Hypertension  HPI   Event Date/Time   First Provider Initiated Contact with Patient 11/12/22 1231     Renee Carson is a 42 y.o. [id number] at [redacted]w[redacted]d who presents to maternity admissions reporting elevated blood pressures in the office (one was severe range) but no symptoms. Denies headache, visual disturbances, dizziness or RUQ pain. Endorses regular fetal movement.   Pregnancy Course: Receives [facility] at [facility], prenatal records reviewed. Was completing GTT today at her prenatal visit and had the following blood pressures: 158/96, 172/101. Sent over by Dr. Macon Large for PEC workup.  Past Medical History:  Diagnosis Date   Advanced maternal age in multigravida, second trimester 04/22/2017   Normal anatomy. Declined cffdna Negative: cmp, tsh, pc ratio.    ASCUS of cervix with negative high risk HPV 05/12/2017      28mo ago     Adequacy  Satisfactory for evaluation  endocervical/transformation zone component PRESENT. Abnormal    Diagnosis  ATYPICAL SQUAMOUS CELLS OF UNDETERMINED SIGNIFICANCE (ASC-US). Abnormal    Chlamydia  Negative   Comment: Normal Reference Range - Negative  Neisseria gonorrhea  Negative   Comment: Normal Reference Range - Negative  HPV  NOT DETECTED   Comment: Normal Reference Range -    Hypertension    resolved   Language barrier 06/02/2011   Postpartum hemorrhage 07/23/2017   OB History  Gravida Para Term Preterm AB Living  6 5 5     5   SAB IAB Ectopic Multiple Live Births        0 5    # Outcome Date GA Lbr Len/2nd Weight Sex Type Anes PTL Lv  6 Current           5 Term 07/23/17 [redacted]w[redacted]d 15:25 / 00:34 7 lb 1.2 oz (3.21 kg) M Vag-Spont EPI  LIV  4 Term 2011     Vag-Spont   LIV     Birth Comments: no complications  3 Term 2010     Vag-Spont   LIV     Birth Comments: No complications  2 Term 2003     Vag-Spont   LIV     Birth Comments: no complications  1 Term 2001     Vag-Spont   LIV     Birth Comments: no complications   Past Surgical History:   Procedure Laterality Date   NO PAST SURGERIES     Family History  Problem Relation Age of Onset   Diabetes Neg Hx    Hypertension Neg Hx    Social History   Tobacco Use   Smoking status: Never   Smokeless tobacco: Never  Vaping Use   Vaping status: Never Used  Substance Use Topics   Alcohol use: No   Drug use: No   No Known Allergies No medications prior to admission.   I have reviewed patient's Past Medical Hx, Surgical Hx, Family Hx, Social Hx, medications and allergies.   ROS  Pertinent items noted in HPI and remainder of comprehensive ROS otherwise negative.   PHYSICAL EXAM  Patient Vitals for the past 24 hrs:  BP Temp Pulse Resp SpO2 Weight  11/12/22 1545 114/64 -- -- -- -- --  11/12/22 1530 109/72 -- -- -- -- --  11/12/22 1515 96/64 -- 85 -- 98 % --  11/12/22 1500 113/71 -- 87 -- 99 % --  11/12/22 1444 111/74 -- 88 -- 98 % --  11/12/22 1430 108/68 -- 89 -- 98 % --  11/12/22 1415 125/86 --  91 -- 99 % --  11/12/22 1400 (!) 125/96 -- 91 -- 99 % --  11/12/22 1345 133/86 -- 88 -- 99 % --  11/12/22 1330 (!) 135/95 -- 98 -- 100 % --  11/12/22 1315 138/87 -- 93 18 98 % --  11/12/22 1245 (!) 141/85 -- 95 20 -- --  11/12/22 1230 128/86 -- 98 18 98 % --  11/12/22 1217 (!) 117/92 -- 96 18 99 % --  11/12/22 1148 (!) 142/91 98.1 F (36.7 C) 99 18 -- 130 lb (59 kg)   Constitutional: Well-developed, well-nourished female in no acute distress.  Cardiovascular: normal rate & rhythm, warm and well-perfused Respiratory: normal effort, no problems with respiration noted GI: Abd soft, non-tender, non-distended MS: Extremities nontender, no edema, normal ROM Neurologic: Alert and oriented x 4.  GU: no CVA tenderness Pelvic: exam deferred  Fetal Tracing: reactive Baseline: 135 Variability: moderate Accelerations: 15x15 Decelerations: none Toco: relaxed   Labs: Results for orders placed or performed during the hospital encounter of 11/12/22 (from the past 24 hour(s))   CBC     Status: Abnormal   Collection Time: 11/12/22 11:23 AM  Result Value Ref Range   WBC 6.6 4.0 - 10.5 K/uL   RBC 5.18 (H) 3.87 - 5.11 MIL/uL   Hemoglobin 11.1 (L) 12.0 - 15.0 g/dL   HCT 16.1 (L) 09.6 - 04.5 %   MCV 65.8 (L) 80.0 - 100.0 fL   MCH 21.4 (L) 26.0 - 34.0 pg   MCHC 32.6 30.0 - 36.0 g/dL   RDW 40.9 (H) 81.1 - 91.4 %   Platelets 157 150 - 400 K/uL   nRBC 0.0 0.0 - 0.2 %  Comprehensive metabolic panel     Status: Abnormal   Collection Time: 11/12/22 11:23 AM  Result Value Ref Range   Sodium 135 135 - 145 mmol/L   Potassium 3.3 (L) 3.5 - 5.1 mmol/L   Chloride 107 98 - 111 mmol/L   CO2 19 (L) 22 - 32 mmol/L   Glucose, Bld 128 (H) 70 - 99 mg/dL   BUN 6 6 - 20 mg/dL   Creatinine, Ser 7.82 0.44 - 1.00 mg/dL   Calcium 8.5 (L) 8.9 - 10.3 mg/dL   Total Protein 6.6 6.5 - 8.1 g/dL   Albumin 3.0 (L) 3.5 - 5.0 g/dL   AST 24 15 - 41 U/L   ALT 12 0 - 44 U/L   Alkaline Phosphatase 133 (H) 38 - 126 U/L   Total Bilirubin 0.1 (L) 0.3 - 1.2 mg/dL   GFR, Estimated >95 >62 mL/min   Anion gap 9 5 - 15  Protein / creatinine ratio, urine     Status: None   Collection Time: 11/12/22 12:57 PM  Result Value Ref Range   Creatinine, Urine 48 mg/dL   Total Protein, Urine 6 mg/dL   Protein Creatinine Ratio 0.13 0.00 - 0.15 mg/mg[Cre]   Imaging:  No results found.  MDM & MAU COURSE  MDM: Moderate  MAU Course: Orders Placed This Encounter  Procedures   CBC   Comprehensive metabolic panel   Protein / creatinine ratio, urine   Discharge patient   No orders of the defined types were placed in this encounter.  Labs ordered on arrival to MAU - BP initially elevated, not severe range. Has hx of chronic hypertension, no meds.   Nst reactive, labs normal. Reviewed vitals with Dr. Donavan Foil who agreed pt could be discharged without adding medication since she has been largely normal. BP  check scheduled for next Wednesday (7/24) with MD follow up on 7/31. Preeclampsia precautions  given. ASSESSMENT   1. Supervision of high risk pregnancy, antepartum   2. History of postpartum hemorrhage, currently pregnant   3. 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 Multigravida of advanced maternal age in third trimester   6. Chronic hypertension   7. NST (non-stress test) reactive   8. [redacted] weeks gestation of pregnancy   9. Chronic hypertension during pregnancy    PLAN  Discharge home in stable condition with PEC precautions.     Follow-up Information     Center for [facility] at [facility] Follow up on 11/17/2022.   Specialty: Obstetrics and Gynecology Why: For BP check, then as scheduled for routine OB care Contact information: [address] [place] [address] [phone]                Allergies as of 11/12/2022   No Known Allergies      Medication List     TAKE these medications    acetaminophen 325 MG tablet Commonly known as: TYLENOL Take 650 mg by mouth every 6 (six) hours as needed.   prenatal vitamin w/FE, FA 27-1 MG Tabs tablet Take 1 tablet by mouth daily at 12 noon.       Edd Arbour, CNM, MSN, IBCLC Certified Nurse Midwife, [facility]

## 2022-11-17 ENCOUNTER — Ambulatory Visit (INDEPENDENT_AMBULATORY_CARE_PROVIDER_SITE_OTHER): Payer: 59

## 2022-11-17 ENCOUNTER — Other Ambulatory Visit: Payer: Self-pay

## 2022-11-17 ENCOUNTER — Other Ambulatory Visit: Payer: Self-pay | Admitting: Obstetrics & Gynecology

## 2022-11-17 VITALS — BP 153/102 | HR 97 | Wt 133.6 lb

## 2022-11-17 DIAGNOSIS — O0990 Supervision of high risk pregnancy, unspecified, unspecified trimester: Secondary | ICD-10-CM

## 2022-11-17 DIAGNOSIS — O0993 Supervision of high risk pregnancy, unspecified, third trimester: Secondary | ICD-10-CM

## 2022-11-17 DIAGNOSIS — O09523 Supervision of elderly multigravida, third trimester: Secondary | ICD-10-CM

## 2022-11-17 DIAGNOSIS — D563 Thalassemia minor: Secondary | ICD-10-CM

## 2022-11-17 DIAGNOSIS — O163 Unspecified maternal hypertension, third trimester: Secondary | ICD-10-CM

## 2022-11-17 DIAGNOSIS — Z3A34 34 weeks gestation of pregnancy: Secondary | ICD-10-CM

## 2022-11-17 DIAGNOSIS — O09299 Supervision of pregnancy with other poor reproductive or obstetric history, unspecified trimester: Secondary | ICD-10-CM

## 2022-11-17 DIAGNOSIS — O289 Unspecified abnormal findings on antenatal screening of mother: Secondary | ICD-10-CM

## 2022-11-17 MED ORDER — NIFEDIPINE ER OSMOTIC RELEASE 30 MG PO TB24: 30.0000 mg | ORAL_TABLET | Freq: Every day | ORAL | 3 refills | Status: DC

## 2022-11-17 NOTE — Addendum Note (Signed)
Addended by: Cinda Quest A on: 11/17/2022 08:59 AM   Modules accepted: Level of Service

## 2022-11-17 NOTE — Progress Notes (Signed)
Blood Pressure Check Visit  Renee Carson is here for blood pressure check at [redacted]w[redacted]d GA. BP today is 158/96 and 153/102. Patient denies any dizzness blurred vision headache shortness of breath peripheral edema.  Patient is not currently taking anything for high blood pressure.  Reviewed with Dr. Macon Large; per provider, patient is to start Nifedipine 30 mg PO daily. Rx sent to pharmacy that was verified with patient.   Meryl Crutch, RN 11/17/2022  8:22 AM

## 2022-11-19 ENCOUNTER — Ambulatory Visit: Payer: 59 | Admitting: *Deleted

## 2022-11-19 ENCOUNTER — Ambulatory Visit (HOSPITAL_BASED_OUTPATIENT_CLINIC_OR_DEPARTMENT_OTHER): Payer: 59

## 2022-11-19 VITALS — BP 145/95 | HR 101

## 2022-11-19 DIAGNOSIS — O0943 Supervision of pregnancy with grand multiparity, third trimester: Secondary | ICD-10-CM | POA: Insufficient documentation

## 2022-11-19 DIAGNOSIS — O288 Other abnormal findings on antenatal screening of mother: Secondary | ICD-10-CM

## 2022-11-19 DIAGNOSIS — O99013 Anemia complicating pregnancy, third trimester: Secondary | ICD-10-CM | POA: Diagnosis present

## 2022-11-19 DIAGNOSIS — Z3A35 35 weeks gestation of pregnancy: Secondary | ICD-10-CM | POA: Insufficient documentation

## 2022-11-19 DIAGNOSIS — O0933 Supervision of pregnancy with insufficient antenatal care, third trimester: Secondary | ICD-10-CM | POA: Diagnosis not present

## 2022-11-19 DIAGNOSIS — O0990 Supervision of high risk pregnancy, unspecified, unspecified trimester: Secondary | ICD-10-CM

## 2022-11-19 DIAGNOSIS — O368330 Maternal care for abnormalities of the fetal heart rate or rhythm, third trimester, not applicable or unspecified: Secondary | ICD-10-CM | POA: Diagnosis not present

## 2022-11-19 DIAGNOSIS — D563 Thalassemia minor: Secondary | ICD-10-CM

## 2022-11-19 DIAGNOSIS — O09523 Supervision of elderly multigravida, third trimester: Secondary | ICD-10-CM

## 2022-11-19 DIAGNOSIS — O10013 Pre-existing essential hypertension complicating pregnancy, third trimester: Secondary | ICD-10-CM

## 2022-11-19 DIAGNOSIS — O10913 Unspecified pre-existing hypertension complicating pregnancy, third trimester: Secondary | ICD-10-CM

## 2022-11-19 DIAGNOSIS — O26843 Uterine size-date discrepancy, third trimester: Secondary | ICD-10-CM | POA: Diagnosis not present

## 2022-11-19 DIAGNOSIS — O09299 Supervision of pregnancy with other poor reproductive or obstetric history, unspecified trimester: Secondary | ICD-10-CM

## 2022-11-19 DIAGNOSIS — Q909 Down syndrome, unspecified: Secondary | ICD-10-CM | POA: Diagnosis not present

## 2022-11-19 DIAGNOSIS — O289 Unspecified abnormal findings on antenatal screening of mother: Secondary | ICD-10-CM

## 2022-11-19 NOTE — Procedures (Signed)
Renee Carson November 08, 1980 [redacted]w[redacted]d  Fetus A Non-Stress Test Interpretation for 11/19/22--NST with BPP  Indication: Chronic Hypertenstion and Advanced Maternal Age >40 years  Fetal Heart Rate A Mode: External Baseline Rate (A): 140 bpm Variability: Moderate Accelerations: 10 x 10 Decelerations: None Multiple birth?: No  Uterine Activity Mode: Toco Contraction Frequency (min): UI with occas u/c Contraction Duration (sec): 110 Contraction Quality: Mild Resting Tone Palpated: Relaxed  Interpretation (Fetal Testing) Nonstress Test Interpretation: Non-reactive Comments: Tracing reviewed byDr. Judeth Cornfield

## 2022-11-24 ENCOUNTER — Other Ambulatory Visit (HOSPITAL_COMMUNITY)
Admission: RE | Admit: 2022-11-24 | Discharge: 2022-11-24 | Disposition: A | Discharge status: Home / Self Care | Payer: 59 | Source: Ambulatory Visit | Attending: Obstetrics and Gynecology | Admitting: Obstetrics and Gynecology

## 2022-11-24 ENCOUNTER — Ambulatory Visit (INDEPENDENT_AMBULATORY_CARE_PROVIDER_SITE_OTHER): Payer: 59 | Admitting: Obstetrics and Gynecology

## 2022-11-24 VITALS — BP 139/94 | HR 110 | Wt 134.0 lb

## 2022-11-24 DIAGNOSIS — Z641 Problems related to multiparity: Secondary | ICD-10-CM

## 2022-11-24 DIAGNOSIS — Z758 Other problems related to medical facilities and other health care: Secondary | ICD-10-CM

## 2022-11-24 DIAGNOSIS — O09299 Supervision of pregnancy with other poor reproductive or obstetric history, unspecified trimester: Secondary | ICD-10-CM

## 2022-11-24 DIAGNOSIS — O10913 Unspecified pre-existing hypertension complicating pregnancy, third trimester: Secondary | ICD-10-CM

## 2022-11-24 DIAGNOSIS — O0990 Supervision of high risk pregnancy, unspecified, unspecified trimester: Secondary | ICD-10-CM | POA: Diagnosis present

## 2022-11-24 DIAGNOSIS — Z3A35 35 weeks gestation of pregnancy: Secondary | ICD-10-CM

## 2022-11-24 DIAGNOSIS — D563 Thalassemia minor: Secondary | ICD-10-CM

## 2022-11-24 DIAGNOSIS — O09293 Supervision of pregnancy with other poor reproductive or obstetric history, third trimester: Secondary | ICD-10-CM

## 2022-11-24 DIAGNOSIS — O10919 Unspecified pre-existing hypertension complicating pregnancy, unspecified trimester: Secondary | ICD-10-CM

## 2022-11-24 DIAGNOSIS — O0993 Supervision of high risk pregnancy, unspecified, third trimester: Secondary | ICD-10-CM

## 2022-11-24 DIAGNOSIS — O09523 Supervision of elderly multigravida, third trimester: Secondary | ICD-10-CM

## 2022-11-24 DIAGNOSIS — O289 Unspecified abnormal findings on antenatal screening of mother: Secondary | ICD-10-CM

## 2022-11-24 DIAGNOSIS — Z603 Acculturation difficulty: Secondary | ICD-10-CM

## 2022-11-24 NOTE — Progress Notes (Signed)
   PRENATAL VISIT NOTE  Subjective:  Renee Carson is a 42 y.o. [id number] at [redacted]w[redacted]d being seen today for ongoing prenatal care.  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  Patient doing well with no acute concerns today. She reports no complaints.  Contractions: Not present. Vag. Bleeding: None.  Movement: Present. Denies leaking of fluid.   The following portions of the patient's history were reviewed and updated as appropriate: allergies, current medications, past family history, past medical history, past social history, past surgical history and problem list. Problem list updated.  Objective:   Vitals:   11/24/22 1440 11/24/22 1500  BP: (!) 143/96 (!) 139/94  Pulse: (!) 107 (!) 110  Weight: 134 lb (60.8 kg)     Fetal Status: Fetal Heart Rate (bpm): 140 Fundal Height: 35 cm Movement: Present     General:  Alert, oriented and cooperative. Patient is in no acute distress.  Skin: Skin is warm and dry. No rash noted.   Cardiovascular: Normal heart rate noted  Respiratory: Normal respiratory effort, no problems with respiration noted  Abdomen: Soft, gravid, appropriate for gestational age.  Pain/Pressure: Absent     Pelvic: Cervical exam deferred        Extremities: Normal range of motion.     Mental Status:  Normal mood and affect. Normal behavior. Normal judgment and thought content.   Assessment and Plan:  Pregnancy: G6P5005 at [redacted]w[redacted]d  1. Supervision of high risk pregnancy, antepartum Continue routine prenatal care IOL at 37 weeks  - Culture, beta strep (group b only) - Cervicovaginal ancillary only( [place])  2. [redacted] weeks  gestation of pregnancy   3. 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 Language barrier Live interpreter present  6. History of postpartum hemorrhage, currently pregnant Provide TXA at time of delivery  7. 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 Multigravida of advanced maternal age in third trimester   Preterm labor symptoms and general obstetric precautions including but not limited to vaginal bleeding, contractions, leaking of fluid and fetal movement were reviewed in detail with the patient.  Please refer to After Visit Summary for other counseling recommendations.   Return in about 1 week (around 12/01/2022) for [facility], in person.   Mariel Aloe, MD Faculty Attending Center for [facility]

## 2022-11-26 ENCOUNTER — Ambulatory Visit: Payer: 59 | Attending: Maternal & Fetal Medicine | Admitting: *Deleted

## 2022-11-26 ENCOUNTER — Encounter: Payer: Self-pay | Admitting: General Practice

## 2022-11-26 ENCOUNTER — Encounter: Payer: Self-pay | Admitting: *Deleted

## 2022-11-26 ENCOUNTER — Ambulatory Visit: Payer: 59 | Admitting: *Deleted

## 2022-11-26 VITALS — BP 139/86 | HR 106

## 2022-11-26 DIAGNOSIS — Z3A36 36 weeks gestation of pregnancy: Secondary | ICD-10-CM | POA: Insufficient documentation

## 2022-11-26 DIAGNOSIS — O09523 Supervision of elderly multigravida, third trimester: Secondary | ICD-10-CM | POA: Diagnosis present

## 2022-11-26 DIAGNOSIS — O10913 Unspecified pre-existing hypertension complicating pregnancy, third trimester: Secondary | ICD-10-CM | POA: Diagnosis present

## 2022-11-26 DIAGNOSIS — O0990 Supervision of high risk pregnancy, unspecified, unspecified trimester: Secondary | ICD-10-CM

## 2022-11-26 DIAGNOSIS — D563 Thalassemia minor: Secondary | ICD-10-CM

## 2022-11-26 DIAGNOSIS — O09299 Supervision of pregnancy with other poor reproductive or obstetric history, unspecified trimester: Secondary | ICD-10-CM

## 2022-11-26 DIAGNOSIS — O289 Unspecified abnormal findings on antenatal screening of mother: Secondary | ICD-10-CM

## 2022-11-26 NOTE — Procedures (Signed)
Renee Carson 12-21-1980 [redacted]w[redacted]d  Fetus A Non-Stress Test Interpretation for 11/26/22  Indication: Advanced Maternal Age >40 years, CHTN  Fetal Heart Rate A Mode: External Baseline Rate (A): 130 bpm Variability: Moderate Accelerations: 15 x 15 Decelerations: None Multiple birth?: No  Uterine Activity Mode: Palpation, Toco Contraction Frequency (min): 2 ucs with ui Contraction Duration (sec): 40-50 Contraction Quality: Mild Resting Tone Palpated: Relaxed Resting Time: Adequate  Interpretation (Fetal Testing) Nonstress Test Interpretation: Reactive Overall Impression: Reassuring for gestational age Comments: Dr. Judeth Cornfield reviewed tracing

## 2022-12-01 ENCOUNTER — Other Ambulatory Visit (INDEPENDENT_AMBULATORY_CARE_PROVIDER_SITE_OTHER): Payer: 59

## 2022-12-01 ENCOUNTER — Other Ambulatory Visit: Payer: Self-pay | Admitting: Advanced Practice Midwife

## 2022-12-01 ENCOUNTER — Other Ambulatory Visit: Payer: Self-pay

## 2022-12-01 ENCOUNTER — Inpatient Hospital Stay (EMERGENCY_DEPARTMENT_HOSPITAL)
Admission: AD | Admit: 2022-12-01 | Discharge: 2022-12-01 | Disposition: A | Discharge status: Home / Self Care | Payer: 59 | Source: Home / Self Care | Attending: Obstetrics and Gynecology | Admitting: Obstetrics and Gynecology

## 2022-12-01 ENCOUNTER — Other Ambulatory Visit: Payer: Self-pay | Admitting: *Deleted

## 2022-12-01 ENCOUNTER — Encounter (HOSPITAL_COMMUNITY): Payer: Self-pay | Admitting: Obstetrics and Gynecology

## 2022-12-01 ENCOUNTER — Ambulatory Visit (INDEPENDENT_AMBULATORY_CARE_PROVIDER_SITE_OTHER): Payer: 59 | Admitting: Obstetrics and Gynecology

## 2022-12-01 VITALS — BP 151/103 | HR 91 | Wt 133.0 lb

## 2022-12-01 DIAGNOSIS — Z3A36 36 weeks gestation of pregnancy: Secondary | ICD-10-CM | POA: Insufficient documentation

## 2022-12-01 DIAGNOSIS — O0993 Supervision of high risk pregnancy, unspecified, third trimester: Secondary | ICD-10-CM

## 2022-12-01 DIAGNOSIS — O10919 Unspecified pre-existing hypertension complicating pregnancy, unspecified trimester: Secondary | ICD-10-CM

## 2022-12-01 DIAGNOSIS — O09299 Supervision of pregnancy with other poor reproductive or obstetric history, unspecified trimester: Secondary | ICD-10-CM

## 2022-12-01 DIAGNOSIS — D563 Thalassemia minor: Secondary | ICD-10-CM

## 2022-12-01 DIAGNOSIS — O09523 Supervision of elderly multigravida, third trimester: Secondary | ICD-10-CM

## 2022-12-01 DIAGNOSIS — O289 Unspecified abnormal findings on antenatal screening of mother: Secondary | ICD-10-CM

## 2022-12-01 DIAGNOSIS — Z0289 Encounter for other administrative examinations: Secondary | ICD-10-CM

## 2022-12-01 DIAGNOSIS — O0990 Supervision of high risk pregnancy, unspecified, unspecified trimester: Secondary | ICD-10-CM

## 2022-12-01 DIAGNOSIS — Z641 Problems related to multiparity: Secondary | ICD-10-CM

## 2022-12-01 DIAGNOSIS — O10913 Unspecified pre-existing hypertension complicating pregnancy, third trimester: Secondary | ICD-10-CM

## 2022-12-01 DIAGNOSIS — Z603 Acculturation difficulty: Secondary | ICD-10-CM

## 2022-12-01 DIAGNOSIS — Z758 Other problems related to medical facilities and other health care: Secondary | ICD-10-CM

## 2022-12-01 DIAGNOSIS — O09293 Supervision of pregnancy with other poor reproductive or obstetric history, third trimester: Secondary | ICD-10-CM

## 2022-12-01 DIAGNOSIS — O1092 Unspecified pre-existing hypertension complicating childbirth: Secondary | ICD-10-CM | POA: Diagnosis not present

## 2022-12-01 LAB — COMPREHENSIVE METABOLIC PANEL
ALT: 15 U/L (ref 0–44)
AST: 23 U/L (ref 15–41)
Albumin: 3.1 g/dL — ABNORMAL LOW (ref 3.5–5.0)
Alkaline Phosphatase: 188 U/L — ABNORMAL HIGH (ref 38–126)
Anion gap: 15 (ref 5–15)
BUN: 7 mg/dL (ref 6–20)
CO2: 20 mmol/L — ABNORMAL LOW (ref 22–32)
Calcium: 9 mg/dL (ref 8.9–10.3)
Chloride: 102 mmol/L (ref 98–111)
Creatinine, Ser: 0.68 mg/dL (ref 0.44–1.00)
GFR, Estimated: >60 mL/min (ref >60)
Glucose, Bld: 74 mg/dL (ref 70–99)
Potassium: 3.7 mmol/L (ref 3.5–5.1)
Sodium: 137 mmol/L (ref 135–145)
Total Bilirubin: 0.7 mg/dL (ref 0.3–1.2)
Total Protein: 6.8 g/dL (ref 6.5–8.1)

## 2022-12-01 LAB — CBC
HCT: 36.4 % (ref 36.0–46.0)
Hemoglobin: 12 g/dL (ref 12.0–15.0)
MCH: 21.9 pg — ABNORMAL LOW (ref 26.0–34.0)
MCHC: 33 g/dL (ref 30.0–36.0)
MCV: 66.4 fL — ABNORMAL LOW (ref 80.0–100.0)
Platelets: 174 10*3/uL (ref 150–400)
RBC: 5.48 MIL/uL — ABNORMAL HIGH (ref 3.87–5.11)
RDW: 18.7 % — ABNORMAL HIGH (ref 11.5–15.5)
WBC: 5.9 10*3/uL (ref 4.0–10.5)
nRBC: 0 % (ref 0.0–0.2)

## 2022-12-01 LAB — PROTEIN / CREATININE RATIO, URINE
Creatinine, Urine: 39 mg/dL
Total Protein, Urine: <6 mg/dL

## 2022-12-01 MED ORDER — LABETALOL HCL 5 MG/ML IV SOLN: 80.0000 mg | INTRAVENOUS | Status: DC | PRN

## 2022-12-01 MED ORDER — LABETALOL HCL 5 MG/ML IV SOLN: 20.0000 mg | INTRAVENOUS | Status: DC | PRN

## 2022-12-01 MED ORDER — LABETALOL HCL 5 MG/ML IV SOLN: 40.0000 mg | INTRAVENOUS | Status: DC | PRN

## 2022-12-01 MED ORDER — HYDRALAZINE HCL 20 MG/ML IJ SOLN: 10.0000 mg | INTRAMUSCULAR | Status: DC | PRN

## 2022-12-01 NOTE — Progress Notes (Signed)
155/101 88p 151/103 91-p

## 2022-12-01 NOTE — Progress Notes (Signed)
   PRENATAL VISIT NOTE  Subjective:  Renee Carson is a 42 y.o. [id number] at [redacted]w[redacted]d being seen today for ongoing prenatal care.  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  Patient doing well with no acute concerns today. She reports no complaints.  Contractions: Irritability. Vag. Bleeding: None.  Movement: Present. Denies leaking of fluid. Pt denies headache, visual changes and RUQ pain.  Bp remained elevated x 2  The following portions of the patient's history were reviewed and updated as appropriate: allergies, current medications, past family history, past medical history, past social history, past surgical history and problem list. Problem list updated.  Objective:   Vitals:   12/01/22 0913 12/01/22 0932  BP: (!) 152/99 (!) 151/103  Pulse: 92 91  Weight: 133 lb (60.3 kg)     Fetal Status: Fetal Heart Rate (bpm): 122 Fundal Height: 36 cm Movement: Present     General:  Alert, oriented and cooperative. Patient is in no acute distress.  Skin: Skin is warm and dry. No rash noted.   Cardiovascular: Normal heart rate noted  Respiratory: Normal respiratory effort, no problems with respiration noted  Abdomen: Soft, gravid, appropriate for gestational age.  Pain/Pressure: Present     Pelvic: Cervical exam deferred        Extremities: Normal range of motion.  Edema: None  Mental Status:  Normal mood and affect. Normal behavior. Normal judgment and thought content.   Assessment and Plan:  Pregnancy: G6P5005 at [redacted]w[redacted]d  1. [redacted] weeks gestation of pregnancy   2. Chronic hypertension during pregnancy Blood pressure remains upper mild range,  will send to MAU for preeclampsia evaluation and possible IOL If discharged pt has IOL oin Friday  3. Multigravida of advanced maternal age in third trimester   4. 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 History of postpartum hemorrhage, currently pregnant Have TXA and other interventions available at time of de;livery  7. Language barrier Daughter had to interpret today  8. 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 Supervision of high risk pregnancy, antepartum IOL friday  Preterm labor symptoms and general obstetric precautions including but not limited to vaginal bleeding, contractions, leaking of fluid and fetal movement were reviewed in detail with the patient.  Please refer to After Visit Summary for other counseling recommendations.   No follow-ups on file.   Mariel Aloe, MD Faculty Attending Center for [facility]

## 2022-12-01 NOTE — MAU Provider Note (Signed)
History     CSN: [id number]  Arrival date and time: 12/01/22 1139   Event Date/Time   First Provider Initiated Contact with Patient 12/01/22 1254      Chief Complaint  Patient presents with   BP Evaluation   Renee Carson is a 42 y.o. [id number] at [redacted]w[redacted]d who presents from her OB office visit today due to elevated BP readings. She has a hx of cHTN and takes procardia. She did not take her medication this morning but states she took it yesterday. Her appointment was too early and she forgot to take it. She denies headache, vision changes, edema, and RUQ pain. She reports +FM, no VB or LOF.   OB History     Gravida  6   Para  5   Term  5   Preterm      AB      Living  5      SAB      IAB      Ectopic      Multiple  0   Live Births  5           Past Medical History:  Diagnosis Date   Advanced maternal age in multigravida, second trimester 04/22/2017   Normal anatomy. Declined cffdna Negative: cmp, tsh, pc ratio.    ASCUS of cervix with negative high risk HPV 05/12/2017      29mo ago     Adequacy  Satisfactory for evaluation  endocervical/transformation zone component PRESENT. Abnormal    Diagnosis  ATYPICAL SQUAMOUS CELLS OF UNDETERMINED SIGNIFICANCE (ASC-US). Abnormal    Chlamydia  Negative   Comment: Normal Reference Range - Negative  Neisseria gonorrhea  Negative   Comment: Normal Reference Range - Negative  HPV  NOT DETECTED   Comment: Normal Reference Range -    Hypertension    resolved   Language barrier 06/02/2011   Postpartum hemorrhage 07/23/2017    Past Surgical History:  Procedure Laterality Date   NO PAST SURGERIES      Family History  Problem Relation Age of Onset   Diabetes Neg Hx    Hypertension Neg Hx     Social History   Tobacco Use   Smoking status: Never   Smokeless tobacco: Never  Vaping Use   Vaping status: Never Used  Substance Use Topics   Alcohol use: No   Drug use: No    Allergies: No Known Allergies  Medications Prior  to Admission  Medication Sig Dispense Refill Last Dose   NIFEdipine (PROCARDIA-XL/NIFEDICAL-XL) 30 MG 24 hr tablet Take 1 tablet (30 mg total) by mouth daily. 30 tablet 3 11/30/2022   prenatal vitamin w/FE, FA (PRENATAL 1 + 1) 27-1 MG TABS tablet Take 1 tablet by mouth daily at 12 noon. 30 tablet 11 11/30/2022   acetaminophen (TYLENOL) 325 MG tablet Take 650 mg by mouth every 6 (six) hours as needed. (Patient not taking: Reported on 11/24/2022)       Review of Systems  Constitutional:  Positive for activity change.  Cardiovascular:  Negative for chest pain.  Gastrointestinal:  Negative for abdominal pain.  Genitourinary:  Negative for vaginal bleeding.  Neurological:  Negative for headaches.   Physical Exam   Blood pressure (!) 137/90, pulse 89, temperature (!) 97.4 F (36.3 C), temperature source Oral, resp. rate 18, last menstrual period 04/16/2022, SpO2 97%, currently breastfeeding. Today's Vitals   12/01/22 1223 12/01/22 1230 12/01/22 1245 12/01/22 1300  BP: (!) 125/91 (!) 134/93 (!) 137/90 121/85  Pulse: 92 93 89 90  Resp: 18     Temp: (!) 97.4 F (36.3 C)     TempSrc: Oral     SpO2: 99% 96% 97%    Physical Exam Vitals and nursing note reviewed.  Constitutional:      General: She is not in acute distress.    Appearance: She is not ill-appearing, toxic-appearing or diaphoretic.  Eyes:     Extraocular Movements: Extraocular movements intact.     Conjunctiva/sclera: Conjunctivae normal.  Cardiovascular:     Rate and Rhythm: Normal rate and regular rhythm.  Pulmonary:     Effort: Pulmonary effort is normal.     Breath sounds: Normal breath sounds.  Abdominal:     Palpations: Abdomen is soft.     Tenderness: There is no abdominal tenderness. There is no guarding or rebound.     Comments: gravid  Musculoskeletal:     Right lower leg: No edema.     Left lower leg: No edema.  Neurological:     Mental Status: She is alert and oriented to person, place, and time.  Psychiatric:         Mood and Affect: Mood normal.        Behavior: Behavior normal.    MAU Course  Procedures NST:  Baseline: 130 bpm, Variability: Good {> 6 bpm), Accelerations: Reactive, and Decelerations: Absent MDM Results for orders placed or performed during the hospital encounter of 12/01/22 (from the past 24 hour(s))  Comprehensive metabolic panel     Status: Abnormal   Collection Time: 12/01/22 12:15 PM  Result Value Ref Range   Sodium 137 135 - 145 mmol/L   Potassium 3.7 3.5 - 5.1 mmol/L   Chloride 102 98 - 111 mmol/L   CO2 20 (L) 22 - 32 mmol/L   Glucose, Bld 74 70 - 99 mg/dL   BUN 7 6 - 20 mg/dL   Creatinine, Ser 8.65 0.44 - 1.00 mg/dL   Calcium 9.0 8.9 - 78.4 mg/dL   Total Protein 6.8 6.5 - 8.1 g/dL   Albumin 3.1 (L) 3.5 - 5.0 g/dL   AST 23 15 - 41 U/L   ALT 15 0 - 44 U/L   Alkaline Phosphatase 188 (H) 38 - 126 U/L   Total Bilirubin 0.7 0.3 - 1.2 mg/dL   GFR, Estimated >69 >62 mL/min   Anion gap 15 5 - 15  CBC     Status: Abnormal   Collection Time: 12/01/22 12:15 PM  Result Value Ref Range   WBC 5.9 4.0 - 10.5 K/uL   RBC 5.48 (H) 3.87 - 5.11 MIL/uL   Hemoglobin 12.0 12.0 - 15.0 g/dL   HCT 95.2 84.1 - 32.4 %   MCV 66.4 (L) 80.0 - 100.0 fL   MCH 21.9 (L) 26.0 - 34.0 pg   MCHC 33.0 30.0 - 36.0 g/dL   RDW 40.1 (H) 02.7 - 25.3 %   Platelets 174 150 - 400 K/uL   nRBC 0.0 0.0 - 0.2 %  Protein / creatinine ratio, urine     Status: None   Collection Time: 12/01/22  1:43 PM  Result Value Ref Range   Creatinine, Urine 39 mg/dL   Total Protein, Urine <6 mg/dL   Protein Creatinine Ratio        0.00 - 0.15 mg/mg[Cre]     Assessment and Plan  1. Chronic hypertension during pregnancy Stable. Did not take her medication this morning due to an early start. BP readings improved during work  up. Work up negative for preE. Ok to wait for induction scheduled 8/9.  -Continue procardia 30 mg daily -MAU return precautions  2. [redacted] weeks gestation of pregnancy     Renee Carson  Renee Carson 12/01/2022, 12:54 PM

## 2022-12-01 NOTE — MAU Note (Signed)
.  Renee Carson is a 42 y.o. at [redacted]w[redacted]d here in MAU reporting: she was sent from office for BP evaluation.  Denies HA, visual disturbances, and epigastric pain.   Denies VB or LOF.  Endorses +FM. LMP: NA Onset of complaint: today Pain score: 0 Vitals:   12/01/22 1223 12/01/22 1230  BP: (!) 125/91 (!) 134/93  Pulse: 92 93  Resp: 18   Temp: (!) 97.4 F (36.3 C)   SpO2: 99% 96%     FHT:125 Lab orders placed from triage:   UA

## 2022-12-01 NOTE — Discharge Instructions (Signed)
Continue your blood pressure medication

## 2022-12-03 ENCOUNTER — Inpatient Hospital Stay (HOSPITAL_COMMUNITY): Payer: 59 | Admitting: Anesthesiology

## 2022-12-03 ENCOUNTER — Other Ambulatory Visit: Payer: Self-pay

## 2022-12-03 ENCOUNTER — Inpatient Hospital Stay (HOSPITAL_COMMUNITY): Payer: 59

## 2022-12-03 ENCOUNTER — Inpatient Hospital Stay (HOSPITAL_COMMUNITY)
Admission: RE | Admit: 2022-12-03 | Discharge: 2022-12-05 | DRG: 807 | Disposition: A | Discharge status: Home / Self Care | Payer: 59 | Attending: Obstetrics and Gynecology | Admitting: Obstetrics and Gynecology

## 2022-12-03 ENCOUNTER — Ambulatory Visit: Payer: 59

## 2022-12-03 ENCOUNTER — Encounter (HOSPITAL_COMMUNITY): Payer: Self-pay | Admitting: Obstetrics and Gynecology

## 2022-12-03 DIAGNOSIS — O10919 Unspecified pre-existing hypertension complicating pregnancy, unspecified trimester: Secondary | ICD-10-CM | POA: Diagnosis present

## 2022-12-03 DIAGNOSIS — Z30017 Encounter for initial prescription of implantable subdermal contraceptive: Secondary | ICD-10-CM | POA: Diagnosis not present

## 2022-12-03 DIAGNOSIS — O99824 Streptococcus B carrier state complicating childbirth: Secondary | ICD-10-CM | POA: Diagnosis present

## 2022-12-03 DIAGNOSIS — O289 Unspecified abnormal findings on antenatal screening of mother: Secondary | ICD-10-CM | POA: Diagnosis present

## 2022-12-03 DIAGNOSIS — O09523 Supervision of elderly multigravida, third trimester: Secondary | ICD-10-CM

## 2022-12-03 DIAGNOSIS — O09299 Supervision of pregnancy with other poor reproductive or obstetric history, unspecified trimester: Secondary | ICD-10-CM

## 2022-12-03 DIAGNOSIS — O99820 Streptococcus B carrier state complicating pregnancy: Secondary | ICD-10-CM

## 2022-12-03 DIAGNOSIS — D563 Thalassemia minor: Secondary | ICD-10-CM

## 2022-12-03 DIAGNOSIS — Z603 Acculturation difficulty: Secondary | ICD-10-CM | POA: Diagnosis present

## 2022-12-03 DIAGNOSIS — O10913 Unspecified pre-existing hypertension complicating pregnancy, third trimester: Secondary | ICD-10-CM | POA: Diagnosis present

## 2022-12-03 DIAGNOSIS — Z148 Genetic carrier of other disease: Secondary | ICD-10-CM

## 2022-12-03 DIAGNOSIS — Z641 Problems related to multiparity: Secondary | ICD-10-CM

## 2022-12-03 DIAGNOSIS — O1002 Pre-existing essential hypertension complicating childbirth: Secondary | ICD-10-CM

## 2022-12-03 DIAGNOSIS — O1092 Unspecified pre-existing hypertension complicating childbirth: Principal | ICD-10-CM | POA: Diagnosis present

## 2022-12-03 DIAGNOSIS — O0990 Supervision of high risk pregnancy, unspecified, unspecified trimester: Principal | ICD-10-CM

## 2022-12-03 DIAGNOSIS — O09529 Supervision of elderly multigravida, unspecified trimester: Secondary | ICD-10-CM

## 2022-12-03 DIAGNOSIS — Z3A37 37 weeks gestation of pregnancy: Secondary | ICD-10-CM

## 2022-12-03 DIAGNOSIS — I10 Essential (primary) hypertension: Secondary | ICD-10-CM | POA: Diagnosis present

## 2022-12-03 LAB — CBC
HCT: 35.3 % — ABNORMAL LOW (ref 36.0–46.0)
Hemoglobin: 11.2 g/dL — ABNORMAL LOW (ref 12.0–15.0)
MCH: 20.9 pg — ABNORMAL LOW (ref 26.0–34.0)
MCHC: 31.7 g/dL (ref 30.0–36.0)
MCV: 65.7 fL — ABNORMAL LOW (ref 80.0–100.0)
Platelets: 164 10*3/uL (ref 150–400)
RBC: 5.37 MIL/uL — ABNORMAL HIGH (ref 3.87–5.11)
RDW: 18.7 % — ABNORMAL HIGH (ref 11.5–15.5)
WBC: 6.7 10*3/uL (ref 4.0–10.5)
nRBC: 0 % (ref 0.0–0.2)

## 2022-12-03 LAB — TYPE AND SCREEN
ABO/RH(D): A POS
Antibody Screen: NEGATIVE
Unit division: 0
Unit division: 0

## 2022-12-03 LAB — COMPREHENSIVE METABOLIC PANEL
ALT: 15 U/L (ref 0–44)
AST: 24 U/L (ref 15–41)
Albumin: 2.9 g/dL — ABNORMAL LOW (ref 3.5–5.0)
Alkaline Phosphatase: 191 U/L — ABNORMAL HIGH (ref 38–126)
Anion gap: 9 (ref 5–15)
BUN: 7 mg/dL (ref 6–20)
CO2: 21 mmol/L — ABNORMAL LOW (ref 22–32)
Calcium: 8.8 mg/dL — ABNORMAL LOW (ref 8.9–10.3)
Chloride: 107 mmol/L (ref 98–111)
Creatinine, Ser: 0.58 mg/dL (ref 0.44–1.00)
GFR, Estimated: >60 mL/min (ref >60)
Glucose, Bld: 84 mg/dL (ref 70–99)
Potassium: 3.5 mmol/L (ref 3.5–5.1)
Sodium: 137 mmol/L (ref 135–145)
Total Bilirubin: 0.3 mg/dL (ref 0.3–1.2)
Total Protein: 6.6 g/dL (ref 6.5–8.1)

## 2022-12-03 LAB — BPAM RBC
Blood Product Expiration Date: 202408282359
Blood Product Expiration Date: 202408282359
Unit Type and Rh: 6200
Unit Type and Rh: 6200

## 2022-12-03 LAB — PROTEIN / CREATININE RATIO, URINE
Creatinine, Urine: 38 mg/dL
Total Protein, Urine: <6 mg/dL

## 2022-12-03 LAB — PREPARE RBC (CROSSMATCH)

## 2022-12-03 LAB — RPR: RPR Ser Ql: NONREACTIVE

## 2022-12-03 MED ORDER — SENNOSIDES-DOCUSATE SODIUM 8.6-50 MG PO TABS: 2.0000 | ORAL_TABLET | ORAL | Status: DC

## 2022-12-03 MED ORDER — WITCH HAZEL-GLYCERIN EX PADS: 1.0000 | MEDICATED_PAD | CUTANEOUS | Status: DC | PRN

## 2022-12-03 MED ORDER — COCONUT OIL OIL: 1.0000 | TOPICAL_OIL | Status: DC | PRN

## 2022-12-03 MED ORDER — ACETAMINOPHEN 325 MG PO TABS: 650.0000 mg | ORAL_TABLET | ORAL | Status: DC | PRN

## 2022-12-03 MED ORDER — LACTATED RINGERS IV SOLN
500.0000 mL | INTRAVENOUS | Status: DC | PRN
  Administered 2022-12-03: 1000 mL via INTRAVENOUS
  Administered 2022-12-03: 500 mL via INTRAVENOUS

## 2022-12-03 MED ORDER — OXYTOCIN-SODIUM CHLORIDE 30-0.9 UT/500ML-% IV SOLN: 1.0000 m[IU]/min | INTRAVENOUS | Status: DC

## 2022-12-03 MED ORDER — ZOLPIDEM TARTRATE 5 MG PO TABS: 5.0000 mg | ORAL_TABLET | Freq: Every evening | ORAL | Status: DC | PRN

## 2022-12-03 MED ORDER — LACTATED RINGERS IV SOLN: INTRAVENOUS | Status: DC

## 2022-12-03 MED ORDER — NIFEDIPINE ER OSMOTIC RELEASE 30 MG PO TB24
30.0000 mg | ORAL_TABLET | Freq: Every day | ORAL | Status: DC
  Administered 2022-12-04 – 2022-12-05 (×2): 30 mg via ORAL
  Filled 2022-12-03 (×2): qty 1

## 2022-12-03 MED ORDER — DIBUCAINE (PERIANAL) 1 % EX OINT: 1.0000 | TOPICAL_OINTMENT | CUTANEOUS | Status: DC | PRN

## 2022-12-03 MED ORDER — ONDANSETRON HCL 4 MG/2ML IJ SOLN: 4.0000 mg | INTRAMUSCULAR | Status: DC | PRN

## 2022-12-03 MED ORDER — SODIUM CHLORIDE 0.9% FLUSH: 3.0000 mL | Freq: Two times a day (BID) | INTRAVENOUS | Status: DC

## 2022-12-03 MED ORDER — TRANEXAMIC ACID-NACL 1000-0.7 MG/100ML-% IV SOLN
1000.0000 mg | INTRAVENOUS | Status: AC
  Administered 2022-12-03: 1000 mg via INTRAVENOUS
  Filled 2022-12-03: qty 100

## 2022-12-03 MED ORDER — MISOPROSTOL 50MCG HALF TABLET
50.0000 ug | ORAL_TABLET | Freq: Once | ORAL | Status: DC
  Filled 2022-12-03: qty 1

## 2022-12-03 MED ORDER — FENTANYL-BUPIVACAINE-NACL 0.5-0.125-0.9 MG/250ML-% EP SOLN
12.0000 mL/h | EPIDURAL | Status: DC | PRN
  Filled 2022-12-03: qty 250

## 2022-12-03 MED ORDER — LIDOCAINE HCL (PF) 1 % IJ SOLN
INTRAMUSCULAR | Status: DC | PRN
  Administered 2022-12-03: 5 mL via EPIDURAL

## 2022-12-03 MED ORDER — PRENATAL MULTIVITAMIN CH
1.0000 | ORAL_TABLET | Freq: Every day | ORAL | Status: DC
  Administered 2022-12-04 – 2022-12-05 (×2): 1 via ORAL
  Filled 2022-12-03 (×2): qty 1

## 2022-12-03 MED ORDER — FENTANYL CITRATE (PF) 100 MCG/2ML IJ SOLN
50.0000 ug | INTRAMUSCULAR | Status: DC | PRN
  Filled 2022-12-03: qty 2

## 2022-12-03 MED ORDER — ONDANSETRON HCL 4 MG PO TABS: 4.0000 mg | ORAL_TABLET | ORAL | Status: DC | PRN

## 2022-12-03 MED ORDER — LACTATED RINGERS IV SOLN: 500.0000 mL | Freq: Once | INTRAVENOUS | Status: DC

## 2022-12-03 MED ORDER — SOD CITRATE-CITRIC ACID 500-334 MG/5ML PO SOLN: 30.0000 mL | ORAL | Status: DC | PRN

## 2022-12-03 MED ORDER — SODIUM CHLORIDE 0.9 % IV SOLN: 250.0000 mL | INTRAVENOUS | Status: DC | PRN

## 2022-12-03 MED ORDER — LIDOCAINE HCL (PF) 1 % IJ SOLN: 30.0000 mL | INTRAMUSCULAR | Status: DC | PRN

## 2022-12-03 MED ORDER — SODIUM CHLORIDE 0.9% FLUSH: 3.0000 mL | INTRAVENOUS | Status: DC | PRN

## 2022-12-03 MED ORDER — ONDANSETRON HCL 4 MG/2ML IJ SOLN: 4.0000 mg | Freq: Four times a day (QID) | INTRAMUSCULAR | Status: DC | PRN

## 2022-12-03 MED ORDER — FUROSEMIDE 20 MG PO TABS
20.0000 mg | ORAL_TABLET | Freq: Every day | ORAL | Status: DC
  Administered 2022-12-04 – 2022-12-05 (×2): 20 mg via ORAL
  Filled 2022-12-03 (×2): qty 1

## 2022-12-03 MED ORDER — PENICILLIN G POT IN DEXTROSE 60000 UNIT/ML IV SOLN
3.0000 10*6.[IU] | INTRAVENOUS | Status: DC
  Administered 2022-12-03 (×2): 3 10*6.[IU] via INTRAVENOUS
  Filled 2022-12-03 (×2): qty 50

## 2022-12-03 MED ORDER — EPHEDRINE 5 MG/ML INJ: 10.0000 mg | INTRAVENOUS | Status: DC | PRN

## 2022-12-03 MED ORDER — PHENYLEPHRINE 80 MCG/ML (10ML) SYRINGE FOR IV PUSH (FOR BLOOD PRESSURE SUPPORT)
80.0000 ug | PREFILLED_SYRINGE | INTRAVENOUS | Status: DC | PRN
  Filled 2022-12-03: qty 10

## 2022-12-03 MED ORDER — OXYTOCIN BOLUS FROM INFUSION
333.0000 mL | Freq: Once | INTRAVENOUS | Status: AC
  Administered 2022-12-03: 333 mL via INTRAVENOUS

## 2022-12-03 MED ORDER — FENTANYL-BUPIVACAINE-NACL 0.5-0.125-0.9 MG/250ML-% EP SOLN
EPIDURAL | Status: DC | PRN
  Administered 2022-12-03: 12 mL/h via EPIDURAL

## 2022-12-03 MED ORDER — OXYTOCIN-SODIUM CHLORIDE 30-0.9 UT/500ML-% IV SOLN
1.0000 m[IU]/min | INTRAVENOUS | Status: DC
  Administered 2022-12-03: 2 m[IU]/min via INTRAVENOUS
  Filled 2022-12-03: qty 500

## 2022-12-03 MED ORDER — MISOPROSTOL 25 MCG QUARTER TABLET
25.0000 ug | ORAL_TABLET | Freq: Once | ORAL | Status: DC
  Filled 2022-12-03: qty 1

## 2022-12-03 MED ORDER — BENZOCAINE-MENTHOL 20-0.5 % EX AERO
1.0000 | INHALATION_SPRAY | CUTANEOUS | Status: DC | PRN
  Administered 2022-12-04: 1 via TOPICAL
  Filled 2022-12-03: qty 56

## 2022-12-03 MED ORDER — IBUPROFEN 600 MG PO TABS
600.0000 mg | ORAL_TABLET | Freq: Four times a day (QID) | ORAL | Status: DC
  Administered 2022-12-04 – 2022-12-05 (×6): 600 mg via ORAL
  Filled 2022-12-03 (×6): qty 1

## 2022-12-03 MED ORDER — SODIUM CHLORIDE 0.9 % IV SOLN
5.0000 10*6.[IU] | Freq: Once | INTRAVENOUS | Status: AC
  Administered 2022-12-03: 5 10*6.[IU] via INTRAVENOUS

## 2022-12-03 MED ORDER — TERBUTALINE SULFATE 1 MG/ML IJ SOLN: 0.2500 mg | Freq: Once | INTRAMUSCULAR | Status: DC | PRN

## 2022-12-03 MED ORDER — PHENYLEPHRINE 80 MCG/ML (10ML) SYRINGE FOR IV PUSH (FOR BLOOD PRESSURE SUPPORT): 80.0000 ug | PREFILLED_SYRINGE | INTRAVENOUS | Status: DC | PRN

## 2022-12-03 MED ORDER — DIPHENHYDRAMINE HCL 50 MG/ML IJ SOLN: 12.5000 mg | INTRAMUSCULAR | Status: DC | PRN

## 2022-12-03 MED ORDER — OXYTOCIN-SODIUM CHLORIDE 30-0.9 UT/500ML-% IV SOLN: 2.5000 [IU]/h | INTRAVENOUS | Status: DC

## 2022-12-03 MED ORDER — SIMETHICONE 80 MG PO CHEW: 80.0000 mg | CHEWABLE_TABLET | ORAL | Status: DC | PRN

## 2022-12-03 MED ORDER — DIPHENHYDRAMINE HCL 25 MG PO CAPS: 25.0000 mg | ORAL_CAPSULE | Freq: Four times a day (QID) | ORAL | Status: DC | PRN

## 2022-12-03 MED ORDER — OXYCODONE-ACETAMINOPHEN 5-325 MG PO TABS: 1.0000 | ORAL_TABLET | ORAL | Status: DC | PRN

## 2022-12-03 NOTE — Anesthesia Preprocedure Evaluation (Signed)
Anesthesia Evaluation  Patient identified by MRN, date of birth, ID band Patient awake    Reviewed: Allergy & Precautions, NPO status , Patient's Chart, lab work & pertinent test results  Airway Mallampati: II  TM Distance: >3 FB Neck ROM: Full    Dental no notable dental hx. (+) Teeth Intact, Dental Advisory Given   Pulmonary neg pulmonary ROS   Pulmonary exam normal breath sounds clear to auscultation       Cardiovascular hypertension (chtn), Normal cardiovascular exam Rhythm:Regular Rate:Normal     Neuro/Psych negative neurological ROS     GI/Hepatic negative GI ROS, Neg liver ROS,,,  Endo/Other  negative endocrine ROS    Renal/GU negative Renal ROS     Musculoskeletal   Abdominal   Peds  Hematology Lab Results      Component                Value               Date                      WBC                      6.7                 12/03/2022                HGB                      11.2 (L)            12/03/2022                HCT                      35.3 (L)            12/03/2022                MCV                      65.7 (L)            12/03/2022                PLT                      164                 12/03/2022          \    Anesthesia Other Findings   Reproductive/Obstetrics (+) Pregnancy                              Anesthesia Physical Anesthesia Plan  ASA: 2  Anesthesia Plan: Epidural   Post-op Pain Management:    Induction:   PONV Risk Score and Plan:   Airway Management Planned:   Additional Equipment:   Intra-op Plan:   Post-operative Plan:   Informed Consent: I have reviewed the patients History and Physical, chart, labs and discussed the procedure including the risks, benefits and alternatives for the proposed anesthesia with the patient or authorized representative who has indicated his/her understanding and acceptance.     Interpreter used for  [employer] Discussed with:   Anesthesia Plan Comments: (37wk G6P5 w Chronic Htn For  LEA)         Anesthesia Quick Evaluation

## 2022-12-03 NOTE — Discharge Summary (Signed)
Postpartum Discharge Summary  Date of Service updated***     Patient Name: Renee Carson DOB: 1980/10/06 MRN: [id number]  Date of admission: 12/03/2022 Delivery date:12/03/2022 Delivering provider: Myrtie Hawk Date of discharge: 12/03/2022  Admitting diagnosis: Chronic hypertension in obstetric context in third trimester [O10.913] Intrauterine pregnancy: [redacted]w[redacted]d     Secondary diagnosis:  Principal Problem:   Vaginal delivery Active Problems:   Language barrier   Chronic hypertension during pregnancy   Supervision of high risk pregnancy, antepartum   History of postpartum hemorrhage, currently pregnant   Carrier of beta thalassemia   Grand multiparity   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 yo [id number] at [redacted]w[redacted]d was admitted to the hospital 12/03/2022 for induction of labor.  Indication for induction:  cHTN .  Patient had an labor course complicated by none Membrane Rupture Time/Date: 6:59 PM,12/03/2022  Delivery Method:Vaginal, Spontaneous Operative Delivery:N/A Episiotomy: None Lacerations:    Details of delivery can be found in separate delivery note.  Patient had a postpartum course complicated by***. Patient is discharged home 12/03/22.  Newborn Data: Birth date:12/03/2022 Birth time:10:43 PM Gender:Female Living status:Living Apgars:9 ,9  Weight:   Magnesium Sulfate received: {Mag received:30440022} BMZ received: No Rhophylac:N/A MMR:N/A T-DaP:Given prenatally Flu: No Transfusion:{Transfusion received:30440034}  Physical exam  Vitals:   12/03/22 2251 12/03/22 2256  12/03/22 2301 12/03/22 2316  BP:   (!) 143/88 (!) 138/91  Pulse:   (!) 101 99  Resp:      Temp:      TempSrc:      SpO2: 99% 99%    Weight:      Height:       General: {Exam; general:21111117} Lochia: {Desc; appropriate/inappropriate:30686::"appropriate"} Uterine Fundus: {Desc; firm/soft:30687} Incision: {Exam; incision:21111123} DVT Evaluation: {Exam; dvt:2111122} Labs: Lab Results  Component Value Date   WBC 6.7 12/03/2022   HGB 11.2 (L) 12/03/2022   HCT 35.3 (L) 12/03/2022   MCV 65.7 (L) 12/03/2022   PLT 164 12/03/2022      Latest Ref Rng & Units 12/03/2022    9:05 AM  CMP  Glucose 70 - 99 mg/dL 84   BUN 6 - 20 mg/dL 7   Creatinine 1.47 - 8.29 mg/dL 5.62   Sodium 130 - 865 mmol/L 137   Potassium 3.5 - 5.1 mmol/L 3.5   Chloride 98 - 111 mmol/L 107   CO2 22 - 32 mmol/L 21   Calcium 8.9 - 10.3 mg/dL 8.8   Total Protein 6.5 - 8.1 g/dL 6.6   Total Bilirubin 0.3 - 1.2 mg/dL 0.3   Alkaline Phos 38 - 126 U/L 191   AST 15 - 41 U/L 24   ALT 0 - 44 U/L 15    Edinburgh Score:    09/28/2017   10:57 AM  Edinburgh Postnatal Depression Scale Screening Tool  I have been able to laugh and see the funny side of things. 0  I have looked forward with enjoyment to things.  0  I have blamed myself unnecessarily when things went wrong. 0  I have been anxious or worried for no good reason. 0  I have felt scared or panicky for no good reason. 0  Things have been getting on top of me. 0  I have been so unhappy that I have had difficulty sleeping. 0  I have felt sad or miserable. 0  I have been so unhappy that I have been crying. 0  The thought of harming myself has occurred to me. 0  Edinburgh Postnatal Depression Scale Total 0     After visit meds:  Allergies as of 12/03/2022   No Known Allergies   Med Rec must be completed prior to using this [facility]***        Discharge home in stable condition Infant Feeding: {Baby feeding:23562} Infant Disposition:{CHL IP OB HOME  WITH WJXBJY:78295} Discharge instruction: per After Visit Summary and Postpartum booklet. Activity: Advance as tolerated. Pelvic rest for 6 weeks.  Diet: {OB AOZH:08657846} Future Appointments:No future appointments. Follow up Visit: Message sent to [facility] 8/9  Please schedule this patient for a In person postpartum visit in 6 weeks with the following provider: Any provider. Additional Postpartum F/U:BP check 1 week  High risk pregnancy complicated by: HTN Delivery mode:  Vaginal, Spontaneous Anticipated Birth Control:  Nexplanon   12/03/2022 Myrtie Hawk, DO

## 2022-12-03 NOTE — Anesthesia Procedure Notes (Signed)
Epidural Patient location during procedure: OB Start time: 12/03/2022 9:36 PM End time: 12/03/2022 9:50 PM  Staffing Anesthesiologist: Trevor Iha, MD Performed: anesthesiologist   Preanesthetic Checklist Completed: patient identified, IV checked, site marked, risks and benefits discussed, surgical consent, monitors and equipment checked, pre-op evaluation and timeout performed  Epidural Patient position: sitting Prep: DuraPrep and site prepped and draped Patient monitoring: continuous pulse ox and blood pressure Approach: midline Injection technique: LOR air  Needle:  Needle type: Tuohy  Needle gauge: 17 G Needle length: 9 cm and 9 Catheter type: closed end flexible Catheter size: 19 Gauge Catheter at skin depth: 10 cm Test dose: negative  Assessment Events: blood not aspirated, no cerebrospinal fluid, injection not painful, no injection resistance, no paresthesia and negative IV test  Additional Notes Patient identified. Risks/Benefits/Options discussed with patient including but not limited to bleeding, infection, nerve damage, paralysis, failed block, incomplete pain control, headache, blood pressure changes, nausea, vomiting, reactions to medication both or allergic, itching and postpartum back pain. Confirmed with bedside nurse the patient's most recent platelet count. Confirmed with patient that they are not currently taking any anticoagulation, have any bleeding history or any family history of bleeding disorders. Patient expressed understanding and wished to proceed. All questions were answered. Sterile technique was used throughout the entire procedure. Please see nursing notes for vital signs. Test dose was given through epidural needle and negative prior to continuing to dose epidural or start infusion. Warning signs of high block given to the patient including shortness of breath, tingling/numbness in hands, complete motor block, or any concerning symptoms with  instructions to call for help. Patient was given instructions on fall risk and not to get out of bed. All questions and concerns addressed with instructions to call with any issues.  1 Attempt (S) . Patient tolerated procedure well.

## 2022-12-03 NOTE — H&P (Addendum)
OBSTETRIC ADMISSION HISTORY AND PHYSICAL Discussion conducted with in person interpreter of [place] Renee Carson is a 42 y.o. female [phone] with IUP at [redacted]w[redacted]d by [place] presenting for IOL 2/2 cHTN, AMA. Pt takes procardia 30 every day for cHTN. She reports +FMs, No LOF, no VB, no blurry vision, headaches or peripheral edema, and RUQ pain.  She plans on breast and bottle feeding. She request nexplanon for birth control. She received her [facility] at [facility]   Dating: By [place] --->  Estimated Date of Delivery: 12/24/22  Sono:    @[redacted]w[redacted]d , CWD, anatomy appeared normal, cephalic presentation, posterior placenta, 2272g, 66% EFW   Prenatal History/Complications: hx PPH w/ transfusion, cHTN, AMA, T21 mosaic  Past Medical History: Past Medical History:  Diagnosis Date   Advanced maternal age in multigravida, second trimester 04/22/2017   Normal anatomy. Declined cffdna Negative: cmp, tsh, pc ratio.    ASCUS of cervix with negative high risk HPV 05/12/2017      88mo ago     Adequacy  Satisfactory for evaluation  endocervical/transformation zone component PRESENT. Abnormal    Diagnosis  ATYPICAL SQUAMOUS CELLS OF UNDETERMINED SIGNIFICANCE (ASC-US). Abnormal    Chlamydia  Negative   Comment: Normal Reference Range - Negative  Neisseria gonorrhea  Negative   Comment: Normal Reference Range - Negative  HPV  NOT DETECTED   Comment: Normal Reference Range -    Hypertension    resolved   Language barrier 06/02/2011   Postpartum hemorrhage 07/23/2017    Past Surgical History: Past Surgical History:  Procedure Laterality Date   NO PAST SURGERIES      Obstetrical History: OB History     Gravida  6   Para  5   Term  5   Preterm      AB      Living  5      SAB      IAB      Ectopic      Multiple  0   Live Births  5           Social History Social History   Socioeconomic History   Marital status: Married    Spouse name: Leonie Douglas   Number of children: Not on file    Years of education: Not on file   Highest education level: Not on file  Occupational History   Not on file  Tobacco Use   Smoking status: Never   Smokeless tobacco: Never  Vaping Use   Vaping status: Never Used  Substance and Sexual Activity   Alcohol use: No   Drug use: No   Sexual activity: Yes    Birth control/protection: None  Other Topics Concern   Not on file  Social History Narrative   Not on file   Social Determinants of Health   Financial Resource Strain: Not on file  Food Insecurity: No Food Insecurity (12/03/2022)   Hunger Vital Sign    Worried About Running Out of Food in the Last Year: Never true    Ran Out of Food in the Last Year: Never true  Recent Concern: Food Insecurity - Food Insecurity Present (10/12/2022)   Hunger Vital Sign    Worried About Running Out of Food in the Last Year: Sometimes true    Ran Out of Food in the Last Year: Sometimes true  Transportation Needs: No Transportation Needs (12/03/2022)   PRAPARE - Administrator, Civil Service (Medical): No  Lack of Transportation (Non-Medical): No  Physical Activity: Not on file  Stress: Not on file  Social Connections: Not on file    Family History: Family History  Problem Relation Age of Onset   Diabetes Neg Hx    Hypertension Neg Hx     Allergies: No Known Allergies  Medications Prior to Admission  Medication Sig Dispense Refill Last Dose   acetaminophen (TYLENOL) 325 MG tablet Take 650 mg by mouth every 6 (six) hours as needed.   12/02/2022 at 1600   NIFEdipine (PROCARDIA-XL/NIFEDICAL-XL) 30 MG 24 hr tablet Take 1 tablet (30 mg total) by mouth daily. 30 tablet 3 12/02/2022 at 0900   prenatal vitamin w/FE, FA (PRENATAL 1 + 1) 27-1 MG TABS tablet Take 1 tablet by mouth daily at 12 noon. 30 tablet 11 12/02/2022 at 0800     Review of Systems   All systems reviewed and negative except as stated in HPI  Blood pressure 125/77, pulse 91, temperature 98 F (36.7 C), temperature source  Oral, resp. rate 16, height 5\' 3"  (1.6 m), weight 60.3 kg, last menstrual period 04/16/2022, currently breastfeeding. General appearance: alert, cooperative, and no distress Lungs: no respiratory distress Extremities: no sign of DVT Presentation: cephalic Fetal monitoringBaseline: 130 bpm, Variability: moderate, Accelerations: 15x15, and Decelerations: Absent Uterine activity intermittent ctx Dilation: 4 Effacement (%): 50 Station: -3 Exam by:: Dr. 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 Anatomy US appeared nml at [redacted]w[redacted]d  Prenatal Transfer Tool  Maternal Diabetes: No Genetic Screening: Abnormal:  Results: Other: t21 mosaic, beta thal carrier Maternal Ultrasounds/Referrals: Normal Fetal Ultrasounds or other Referrals:  None Maternal Substance Abuse:  No Significant Maternal Medications:  procardia 30 cHTN Significant Maternal Lab Results:  Group B Strep positive Number of Prenatal Visits:greater than 3 verified prenatal visits Other Comments:  None  Results for orders placed or performed during the hospital encounter of 12/03/22 (from the past 24 hour(s))  CBC   Collection Time: 12/03/22  9:05 AM  Result Value Ref Range   WBC 6.7 4.0 - 10.5 K/uL   RBC 5.37 (H) 3.87 - 5.11 MIL/uL   Hemoglobin 11.2 (L) 12.0 - 15.0 g/dL   HCT 96.0 (L) 45.4 - 09.8 %   MCV 65.7 (L) 80.0 - 100.0 fL   MCH 20.9 (L) 26.0 - 34.0 pg   MCHC 31.7 30.0 - 36.0 g/dL   RDW 11.9 (H) 14.7 - 82.9 %   Platelets 164 150 - 400 K/uL   nRBC 0.0 0.0 - 0.2 %  RPR   Collection Time: 12/03/22  9:05 AM  Result Value Ref Range   RPR Ser Ql NON REACTIVE NON REACTIVE  Comprehensive metabolic panel   Collection Time: 12/03/22  9:05 AM   Result Value Ref Range   Sodium 137 135 - 145 mmol/L   Potassium 3.5 3.5 - 5.1 mmol/L   Chloride 107 98 - 111 mmol/L   CO2 21 (L) 22 - 32 mmol/L   Glucose, Bld 84 70 - 99 mg/dL   BUN 7 6 - 20 mg/dL   Creatinine, Ser 5.62 0.44 - 1.00 mg/dL   Calcium 8.8 (L) 8.9 - 10.3 mg/dL   Total Protein 6.6 6.5 - 8.1 g/dL  Albumin 2.9 (L) 3.5 - 5.0 g/dL   AST 24 15 - 41 U/L   ALT 15 0 - 44 U/L   Alkaline Phosphatase 191 (H) 38 - 126 U/L   Total Bilirubin 0.3 0.3 - 1.2 mg/dL   GFR, Estimated >16 >10 mL/min   Anion gap 9 5 - 15  Type and screen   Collection Time: 12/03/22  9:10 AM  Result Value Ref Range   ABO/RH(D) A POS    Antibody Screen NEG    Sample Expiration 12/06/2022,2359    Unit Number [id number]    Blood Component Type RED CELLS,LR    Unit division 00    Status of Unit ALLOCATED    Transfusion Status OK TO TRANSFUSE    Crossmatch Result      Compatible Performed at [facility], [address] [place], [place] [address]    Unit Number [id number]    Blood Component Type RED CELLS,LR    Unit division 00    Status of Unit ALLOCATED    Transfusion Status OK TO TRANSFUSE    Crossmatch Result Compatible   BPAM RBC   Collection Time: 12/03/22  9:10 AM  Result Value Ref Range   Blood Product Unit Number [id number]    PRODUCT CODE [id number]    Unit Type and Rh 6200    Blood Product Expiration Date [id number]    Blood Product Unit Number [id number]    PRODUCT CODE [id number]    Unit Type and Rh 6200    Blood Product Expiration Date [id number]   Prepare RBC (crossmatch)   Collection Time: 12/03/22  9:45 AM  Result Value Ref Range   Order Confirmation      ORDER PROCESSED BY BLOOD BANK Performed at [facility], [address] [place], [place] [address]     Patient Active Problem List   Diagnosis Date Noted   Chronic hypertension in obstetric context in third trimester 12/03/2022   AMA (advanced maternal age) multigravida 35+ 11/01/2022   NIPS  with possible T21 mosaicism 10/20/2022   Carrier of beta thalassemia 10/15/2022   Grand multiparity 10/15/2022   History of postpartum hemorrhage, currently pregnant 10/12/2022   Supervision of high risk pregnancy, antepartum 10/05/2022   Chronic hypertension during pregnancy 07/22/2017   Language barrier 06/02/2011    Assessment/Plan:  Renee Carson is a 42 y.o. G6P5005 at [redacted]w[redacted]d here for IOL 2/2 cHTN  #Labor: FB, pit --> arom #Pain: Per pt request, would like to consider epidural later if pain becomes intolerable, pt has had 2 babies with epidural and 2 without #FWB: Cat 1 #ID:  Gbs pos, PCN #MOF: both #MOC:nexplanon #Circ:  No  #cHTN: continue procardia 30, CMP/CBC nml, p/c pending #hx PPH: will have TXA on standby for delivery  Kirstie Everhart, DO  12/03/2022, 1:22 PM   Fellow Attestation  I saw and evaluated the patient, performing the key elements of the service.I  personally performed or re-performed the history, physical exam, and medical decision making activities of this service and have verified that the service and findings are accurately documented in the resident's note. I developed the management plan that is described in the resident's note, and I agree with the content, with my edits above.   Alfredia Ferguson, MD,MPH OB Fellow, Faculty Practice  12/03/2022 5:02 PM

## 2022-12-03 NOTE — Progress Notes (Signed)
Labor Progress Note Renee Carson is a 42 y.o. [id number] at [redacted]w[redacted]d presented for IOL due to cHTN, AMA; and has a history of PPH requiring transfusion S: doing well. Feeling mild cramping from contractions.  O:  BP (!) 138/90   Pulse 87   Temp 98 F (36.7 C)   Resp 16   Ht 5\' 3"  (1.6 m)   Wt 60.3 kg   LMP 04/16/2022 (Exact Date)   BMI 23.56 kg/m  EFM: 135bpm /moderate /+accels, no decels  CVE: Dilation: 5 Effacement (%): 60 Station: -3 Presentation: Vertex Exam by:: Dr. Ladon Applebaum   A&P: 42 y.o. N5A2130 [redacted]w[redacted]d here for IOL for cHTN, AMA. #Labor: IOL continues, FB out, AROM bloody show 1900 #Pain: family support, IV fentanyl, epidural when ready  #FWB: Cat 1 #GBS positive + PCN  cHTN:  blood pressures slightly elevated, non severe. Labs normal. CTM.  History of PPH requiring transfusion: 2 units pRBC prepared. TXA at delivery, low threshold for JADA.  Para March DO PGY-1 FM  [place], Center for [facility] 12/03/2022

## 2022-12-03 NOTE — Progress Notes (Signed)
Labor Progress Note Renee Carson is a 42 y.o. [id number] at [redacted]w[redacted]d presented for IOL due to cHTN, AMA; and has a history of PPH requiring transfusion S: doing well. Feeling mild cramping from contractions.  O:  BP 134/89   Pulse 92   Temp 98 F (36.7 C)   Resp 16   Ht 5\' 3"  (1.6 m)   Wt 60.3 kg   LMP 04/16/2022 (Exact Date)   BMI 23.56 kg/m  EFM: 135bpm /moderate /+accels, no decels  CVE: Dilation: 1.5 Effacement (%): 50 Station: -3 Presentation: Vertex Exam by:: Travares Nelles   A&P: 42 y.o. A5W0981 [redacted]w[redacted]d here for IOL for cHTN, AMA. #Labor: IOL continues, FB placed, with 60cc following recent check. Pitocin at 4 units, having contractions every 2 mins and so will hold pitocin at current dose for now, until FB out. Will consider AROM at that time. #Pain: family support, IV fentanyl, epidural when ready  #FWB: Cat 1 #GBS positive + PCN  cHTN:  blood pressures slightly elevated, non severe. Labs normal. CTM.  History of PPH requiring transfusion: 2 units pRBC prepared. TXA at delivery, low threshold for JADA.  Sheppard Evens MD MPH OB Fellow, Faculty Practice [facility], Center for [facility] 12/03/2022

## 2022-12-04 NOTE — Progress Notes (Signed)
[place] interpeter for [facility] came to [facility] and did interpretering for patient.

## 2022-12-04 NOTE — Progress Notes (Signed)
Night shift Rn called [phone] [facility] interpreter no answer and disconnected.  Called Kennyth Lose does not have language.  Rn called Leak the day interpreter. [phone].  Education given on jaundice, PKU, heart screen and feeds. Asked if any questions. Etc.

## 2022-12-04 NOTE — Progress Notes (Addendum)
POSTPARTUM PROGRESS NOTE  Subjective: Renee Carson is a 42 y.o. [id number] s/p VD at [redacted]w[redacted]d.  She reports she is doing well. No acute events overnight. She denies any problems with ambulating. She has not yet voided or had PO intake. Denies nausea or vomiting. She has not passed flatus. Pain is well controlled.  Lochia is mild. She reports trying breastfeeding but still waiting for more milk letdown.  Objective: Blood pressure 123/85, pulse 81, temperature 98.3 F (36.8 C), temperature source Oral, resp. rate 18, height 5\' 3"  (1.6 m), weight 60.3 kg, last menstrual period 04/16/2022, SpO2 99%.  Physical Exam:  General: alert, cooperative and no distress Chest: no respiratory distress Abdomen: soft, non-tender  Uterine Fundus: firm and at level of umbilicus Extremities: No calf swelling or tenderness  Recent Labs    12/03/22 0905 12/04/22 0508  HGB 11.2* 11.0*  HCT 35.3* 33.7*    Assessment/Plan: Renee Carson is a 42 y.o. A5W0981 PPD#1 s/p VD at [redacted]w[redacted]d. IOL for cHTN.  Routine Postpartum Care: Doing well, pain well-controlled.  -- Continue routine care, lactation support  -- Contraception: nexplanon -- Feeding: Breastfeeding and bottle feeding cHTN- BP appropriate continue to monitor Dispo: Plan for discharge PPD#2, continue routine postop care.   [place], [place] E, Medical Student Faculty Practice, Center for [employer] 12/04/2022 7:23 AM   GME ATTESTATION:  I saw and evaluated the patient. I agree with the findings and the plan of care as documented in the student's note. I have made changes to documentation as necessary.  Lavonda Jumbo, DO OB Fellow, Faculty [facility], Center for [facility] 12/04/2022, 10:44 AM  .

## 2022-12-04 NOTE — Lactation Note (Signed)
This note was copied from a baby's chart. Lactation Consultation Note Experienced BF mom is BF/formula feeding. Declines Lactation services.  Patient Name: Renee Carson Today's Date: 12/04/2022 Age:43 hours     Maternal Data    Feeding    LATCH Score Latch: Repeated attempts needed to sustain latch, nipple held in mouth throughout feeding, stimulation needed to elicit sucking reflex.  Audible Swallowing: A few with stimulation  Type of Nipple: Everted at rest and after stimulation  Comfort (Breast/Nipple): Soft / non-tender  Hold (Positioning): Assistance needed to correctly position infant at breast and maintain latch.  LATCH Score: 7   Lactation Tools Discussed/Used    Interventions    Discharge    Consult Status Consult Status: Complete    Elmira Olkowski G 12/04/2022, 1:01 AM

## 2022-12-04 NOTE — Anesthesia Postprocedure Evaluation (Signed)
Anesthesia Post Note  Patient: Renee Carson  Procedure(s) Performed: AN AD HOC LABOR EPIDURAL     Patient location during evaluation: Mother Baby Anesthesia Type: Epidural Level of consciousness: awake and alert Pain management: pain level controlled Vital Signs Assessment: post-procedure vital signs reviewed and stable Respiratory status: spontaneous breathing, nonlabored ventilation and respiratory function stable Cardiovascular status: stable Postop Assessment: no headache, no backache and epidural receding Anesthetic complications: no   No notable events documented.  Last Vitals:  Vitals:   12/04/22 0148 12/04/22 0549  BP: (!) 132/93 123/85  Pulse: 87 81  Resp: 18 18  Temp: 36.8 C 36.8 C  SpO2:      Last Pain:  Vitals:   12/04/22 0727  TempSrc:   PainSc: 0-No pain   Pain Goal:                   [facility]ica Records

## 2022-12-04 NOTE — Progress Notes (Addendum)
Dr Lanae Crumbly gave a verbal order to call if patient's BP is 160/110 or greater.

## 2022-12-05 ENCOUNTER — Encounter (HOSPITAL_COMMUNITY): Payer: Self-pay | Admitting: Obstetrics and Gynecology

## 2022-12-05 DIAGNOSIS — Z30017 Encounter for initial prescription of implantable subdermal contraceptive: Secondary | ICD-10-CM

## 2022-12-05 HISTORY — PX: PR INSERTION DRUG DELIVERY IMPLANT: 11981

## 2022-12-05 MED ORDER — DOCUSATE SODIUM 100 MG PO CAPS: 100.0000 mg | ORAL_CAPSULE | Freq: Two times a day (BID) | ORAL | 0 refills | Status: DC

## 2022-12-05 MED ORDER — ETONOGESTREL 68 MG ~~LOC~~ IMPL
68.0000 mg | DRUG_IMPLANT | Freq: Once | SUBCUTANEOUS | Status: AC
  Administered 2022-12-05: 68 mg via SUBCUTANEOUS
  Filled 2022-12-05: qty 1

## 2022-12-05 MED ORDER — FUROSEMIDE 20 MG PO TABS: 20.0000 mg | ORAL_TABLET | Freq: Every day | ORAL | 0 refills | Status: DC

## 2022-12-05 MED ORDER — LIDOCAINE HCL 1 % IJ SOLN
0.0000 mL | Freq: Once | INTRAMUSCULAR | Status: AC | PRN
  Administered 2022-12-05: 20 mL via INTRADERMAL
  Filled 2022-12-05: qty 20

## 2022-12-05 NOTE — Procedures (Cosign Needed Addendum)
Nexplanon Insertion PROCEDURE NOTE Ms. Renee Carson is a 42 y.o. [id number] here for Nexplanon insertion. Last pap smear was on 04/22/2017 and was ASC-US.  No other gynecologic concerns.  Patient was given informed consent, she signed consent form.  Patient does understand that irregular bleeding is a very common side effect of this medication. She was advised to have backup contraception for one week after placement. Pregnancy test in clinic today was negative.  Appropriate time out taken.  Patient's left arm was prepped and draped in the usual sterile fashion.. The ruler used to measure and mark insertion area.  Patient was prepped with alcohol swab and then injected with 3 ml of 1% lidocaine.  She was prepped with betadine, Nexplanon removed from packaging,  Device confirmed in needle, then inserted full length of needle and withdrawn per handbook instructions. Nexplanon was able to palpated in the patient's arm; patient palpated the insert herself. There was minimal blood loss.  Patient insertion site covered with guaze and a pressure bandage to reduce any bruising.  The patient tolerated the procedure well and was given post procedure instructions.  Exp 2026-04 Lot [id number]   Hessie Dibble 12/05/2022 1:21 PM

## 2022-12-13 ENCOUNTER — Other Ambulatory Visit: Payer: Self-pay

## 2022-12-13 ENCOUNTER — Ambulatory Visit (INDEPENDENT_AMBULATORY_CARE_PROVIDER_SITE_OTHER): Payer: 59 | Admitting: General Practice

## 2022-12-13 VITALS — BP 125/90 | HR 83 | Ht 64.0 in | Wt 120.0 lb

## 2022-12-13 DIAGNOSIS — Z0130 Encounter for examination of blood pressure without abnormal findings: Secondary | ICD-10-CM

## 2022-12-13 NOTE — Progress Notes (Signed)
Patient presents to office today for BP check following vaginal delivery on 8/9. Her hx is significant for [facility]. She was sent home on lasix x 5 days and Nifedipine 30mg  daily. She just started taking the Nifedipine yesterday- she didn't want to take it while on Lasix because she was afraid that would be too much. Patient reports some lightheadedness/dizziness since being home when she is up & doing activities. Denies headaches. No edema noted. Patient has not taken Nifedipine yet today as she was trying to make it to her appt today. Reviewed importance of taking medicine daily & to call us if she has any questions/concerns about her medicine. Patient will return for pp visit.   Chase Caller RN BSN 12/13/22

## 2023-01-13 ENCOUNTER — Ambulatory Visit (INDEPENDENT_AMBULATORY_CARE_PROVIDER_SITE_OTHER): Payer: 59 | Admitting: Obstetrics and Gynecology

## 2023-01-13 ENCOUNTER — Other Ambulatory Visit (HOSPITAL_COMMUNITY)
Admission: RE | Admit: 2023-01-13 | Discharge: 2023-01-13 | Disposition: A | Discharge status: Home / Self Care | Payer: 59 | Source: Ambulatory Visit | Attending: Obstetrics and Gynecology | Admitting: Obstetrics and Gynecology

## 2023-01-13 DIAGNOSIS — O10919 Unspecified pre-existing hypertension complicating pregnancy, unspecified trimester: Secondary | ICD-10-CM

## 2023-01-13 DIAGNOSIS — Z124 Encounter for screening for malignant neoplasm of cervix: Secondary | ICD-10-CM | POA: Diagnosis present

## 2023-01-13 DIAGNOSIS — Z758 Other problems related to medical facilities and other health care: Secondary | ICD-10-CM

## 2023-01-13 DIAGNOSIS — Z603 Acculturation difficulty: Secondary | ICD-10-CM

## 2023-01-13 NOTE — Progress Notes (Signed)
Post Partum Visit Note  Renee Carson is a 42 y.o. [id number] female who presents for a postpartum visit. She is 5.6 week postpartum following a normal spontaneous vaginal delivery.  I have fully reviewed the prenatal and intrapartum course. The delivery was at 37 gestational weeks.  Anesthesia: epidural. Postpartum course has been "good" per pt. Baby is doing well. Baby is feeding by both breast and formula she received in the hospital. Bleeding no bleeding. Bowel function is normal. Bladder function is normal. Patient is not sexually active. Contraception method is Nexplanon. Postpartum depression screening: negative.   The pregnancy intention screening data noted above was reviewed. Potential methods of contraception were discussed. The patient elected to proceed with No data recorded.    Health Maintenance Due  Topic Date Due   Cervical Cancer Screening (HPV/Pap Cotest)  04/22/2020   INFLUENZA VACCINE  11/25/2022   COVID-19 Vaccine (1 - 2023-24 season) Never done    The following portions of the patient's history were reviewed and updated as appropriate: allergies, current medications, past family history, past medical history, past social history, past surgical history, and problem list.  Review of Systems Pertinent items are noted in HPI.  Objective:  BP (!) 134/95   Pulse 70   Wt 120 lb 3.2 oz (54.5 kg)   Breastfeeding Yes Comment: and formula feeding as well  BMI 20.63 kg/m    General:  alert and cooperative   Breasts:  not indicated  Lungs: Normal effort  Abdomen: soft, non-tender; bowel sounds normal; no masses,  no organomegaly   Wound N/a  GU exam:   Normal external genitalia, mild vaginal pallor, cervix shortened with slight thickening at 12 oclock        Assessment:   1. Postpartum state  2. Cervical cancer screening Pap collected, last was abnormal in 2018 - Cytology - PAP  3. Chronic hypertension during pregnancy Continue nifedipine and follow up with  primary care provider  4. Language barrier In person interpreter used   routine postpartum exam.   Plan:   Essential components of care per ACOG recommendations:  1.  Mood and well being: Patient with negative depression screening today. Reviewed local resources for support.  - Patient tobacco use? No.   - hx of drug use? No.    2. Infant care and feeding:  -Patient currently breastmilk feeding? Yes. Reviewed importance of draining breast regularly to support lactation.  -Social determinants of health (SDOH) reviewed in EPIC. No concerns  3. Sexuality, contraception and birth spacing - Patient does not want a pregnancy in the next year.  Desired family size is 6 children.  - Reviewed reproductive life planning. Reviewed contraceptive methods based on pt preferences and effectiveness.  Patient desired Hormonal Implant today.   - Discussed birth spacing of 18 months  4. Sleep and fatigue -Encouraged family/partner/community support of 4 hrs of uninterrupted sleep to help with mood and fatigue  5. Physical Recovery  - Discussed patients delivery and complications. She describes her labor as good. - Patient had a Vaginal, no problems at delivery. Patient had a  none  laceration. Perineal healing reviewed. Patient expressed understanding - Patient has urinary incontinence? No. - Patient is safe to resume physical and sexual activity  6.  Health Maintenance - HM due items addressed Yes - Last pap smear  Diagnosis  Date Value Ref Range Status  04/22/2017 (A)  Final   ATYPICAL SQUAMOUS CELLS OF UNDETERMINED SIGNIFICANCE (ASC-US).   Pap smear done  at today's visit.  -Breast Cancer screening indicated? Yes. Patient referred today for mammogram.   7. Chronic Disease/Pregnancy Condition follow up: Hypertension and Abnormal pap smear  - PCP follow up  Quintella Reichert, RN [facility] [employer], [facility]  Lorriane Shire, MD, FACOG Minimally Invasive  Gynecologic Surgery  Obstetrics and Gynecology, [facility], [facility] 01/13/2023

## 2023-01-17 LAB — CYTOLOGY - PAP
Comment: NEGATIVE
Diagnosis: NEGATIVE
High risk HPV: NEGATIVE

## 2023-01-25 ENCOUNTER — Telehealth: Payer: Self-pay

## 2023-01-25 NOTE — Telephone Encounter (Addendum)
-----   Message from [place] sent at 01/18/2023  5:00 PM EDT ----- Notify of normal pap and repeat due in 3-5 years  Called pt with CAP interpreter Moise. Called both listed phone numbers for patient and VM left stating results from recent visit were normal and callback number given for patient to call for details.

## 2023-03-07 ENCOUNTER — Other Ambulatory Visit: Payer: Self-pay

## 2023-03-07 ENCOUNTER — Encounter: Payer: Self-pay | Admitting: Family Medicine

## 2023-03-07 ENCOUNTER — Ambulatory Visit (INDEPENDENT_AMBULATORY_CARE_PROVIDER_SITE_OTHER): Payer: 59 | Admitting: Family Medicine

## 2023-03-07 VITALS — BP 142/86 | HR 93 | Ht 64.0 in | Wt 125.2 lb

## 2023-03-07 DIAGNOSIS — Z0000 Encounter for general adult medical examination without abnormal findings: Secondary | ICD-10-CM

## 2023-03-07 DIAGNOSIS — O163 Unspecified maternal hypertension, third trimester: Secondary | ICD-10-CM

## 2023-03-07 DIAGNOSIS — I10 Essential (primary) hypertension: Secondary | ICD-10-CM | POA: Diagnosis not present

## 2023-03-07 DIAGNOSIS — O0990 Supervision of high risk pregnancy, unspecified, unspecified trimester: Secondary | ICD-10-CM

## 2023-03-07 DIAGNOSIS — Z1231 Encounter for screening mammogram for malignant neoplasm of breast: Secondary | ICD-10-CM | POA: Diagnosis not present

## 2023-03-07 DIAGNOSIS — Z23 Encounter for immunization: Secondary | ICD-10-CM | POA: Diagnosis not present

## 2023-03-07 MED ORDER — NIFEDIPINE ER OSMOTIC RELEASE 30 MG PO TB24
30.0000 mg | ORAL_TABLET | Freq: Every day | ORAL | 3 refills | Status: DC
Start: 2023-03-07 — End: 2023-06-24

## 2023-03-07 NOTE — Progress Notes (Unsigned)
   GYNECOLOGY ANNUAL PREVENTATIVE CARE ENCOUNTER NOTE  Subjective:   Renee Carson is a 42 y.o. [id number] female here for a routine annual gynecologic exam.  Current complaints: neck pain, occasionally and needs refill on nifedipine.     Menses are irregular, has Nexplanon  Denies abnormal vaginal bleeding, discharge, pelvic pain, problems with intercourse or other gynecologic concerns.    Gynecologic History Patient's last menstrual period was 02/28/2023 (exact date). Contraception: Nexplanon Last Pap: 2023. Results were: normal Last mammogram: never had.   Health Maintenance Due  Topic Date Due   COVID-19 Vaccine (1 - 2023-24 season) Never done    The following portions of the patient's history were reviewed and updated as appropriate: allergies, current medications, past family history, past medical history, past social history, past surgical history and problem list.  Review of Systems Pertinent items are noted in HPI.   Objective:  BP (!) 142/86   Pulse 93   Ht 5\' 4"  (1.626 m)   Wt 125 lb 3.2 oz (56.8 kg)   LMP 02/28/2023 (Exact Date)   Breastfeeding Yes Comment: breastfeeding and formula  BMI 21.49 kg/m  CONSTITUTIONAL: Well-developed, well-nourished female in no acute distress.  HENT:  Normocephalic, atraumatic, External right and left ear normal. Oropharynx is clear and moist EYES:  No scleral icterus.  NECK: Normal range of motion, supple, no masses.  Normal thyroid.  SKIN: Skin is warm and dry. No rash noted. Not diaphoretic. No erythema. No pallor. NEUROLOGIC: Alert and oriented to person, place, and time. Normal reflexes, muscle tone coordination. No cranial nerve deficit noted. PSYCHIATRIC: Normal mood and affect. Normal behavior. Normal judgment and thought content. CARDIOVASCULAR: Normal heart rate noted, regular rhythm. 2+ distal pulses. RESPIRATORY: Effort and breath sounds normal, no problems with respiration noted. BREASTS: Symmetric in size. No masses, skin  changes, nipple drainage, or lymphadenopathy. ABDOMEN: Soft,  no distention noted.  No tenderness, rebound or guarding.  PELVIC: recently has pap, genital exam not indicated MUSCULOSKELETAL: Normal range of motion.   Assessment and Plan:  1) Annual gynecologic examination:  Will follow up results of pap smear and manage accordingly. STI screening desired No.  Routine preventative health maintenance measures emphasized. Reviewed perimenopausal symptoms and management.   2) Contraception counseling: using nexplanon.  1. Physical exam, annual  2. Breast cancer screening by mammogram - MM 3D SCREENING MAMMOGRAM BILATERAL BREAST; Future  3. Chronic hypertension Refilled procardia today Needs to establish PCP care Today in clinic I signed her up for a PCP visit to help get her established  Patient appreciative of support Given 3 months of procardia but expect that PCP will change or take over this prescription.  - NIFEdipine (PROCARDIA-XL/NIFEDICAL-XL) 30 MG 24 hr tablet; Take 1 tablet (30 mg total) by mouth daily.  Dispense: 30 tablet; Refill: 3   Please refer to After Visit Summary for other counseling recommendations.   Return in about 1 year (around 03/06/2024) for Yearly wellness exam.  Future Appointments  Date Time Provider Department Center  03/23/2023 11:00 AM Alveria Apley, NP [facility]  04/01/2023  4:10 PM [facility] MM 3 [facility]     Federico Flake, MD, MPH, ABFM Attending Physician Center for [facility]

## 2023-03-07 NOTE — Patient Instructions (Addendum)
Mammogram Appointment scheduled for Friday, December 6th, at 4:10PM. Please arrive at 3:50PM, and bring your insurance card and a picture ID. Please do NOT wear any deodorants, perfumes, or powders; please wear two-piece clothing. If you need to cancel or reschedule, please call the Breast Center office at least 24 hours before your scheduled appointment to avoid a $75 fee.   [facility] Address: [address], [place], [place] [address] Phone: [phone]

## 2023-03-23 ENCOUNTER — Encounter: Payer: Self-pay | Admitting: Family Medicine

## 2023-03-23 ENCOUNTER — Ambulatory Visit: Payer: 59

## 2023-03-23 ENCOUNTER — Ambulatory Visit: Payer: 59 | Admitting: Family Medicine

## 2023-03-23 ENCOUNTER — Ambulatory Visit (INDEPENDENT_AMBULATORY_CARE_PROVIDER_SITE_OTHER): Payer: 59

## 2023-03-23 VITALS — BP 130/80 | HR 94 | Temp 98.0°F | Ht 64.0 in | Wt 125.8 lb

## 2023-03-23 DIAGNOSIS — R202 Paresthesia of skin: Secondary | ICD-10-CM | POA: Diagnosis not present

## 2023-03-23 DIAGNOSIS — R899 Unspecified abnormal finding in specimens from other organs, systems and tissues: Secondary | ICD-10-CM

## 2023-03-23 DIAGNOSIS — M25551 Pain in right hip: Secondary | ICD-10-CM

## 2023-03-23 DIAGNOSIS — I10 Essential (primary) hypertension: Secondary | ICD-10-CM | POA: Diagnosis not present

## 2023-03-23 DIAGNOSIS — Z7689 Persons encountering health services in other specified circumstances: Secondary | ICD-10-CM

## 2023-03-23 DIAGNOSIS — R200 Anesthesia of skin: Secondary | ICD-10-CM | POA: Diagnosis not present

## 2023-03-23 LAB — CBC WITH DIFFERENTIAL/PLATELET
Basophils Absolute: 0.1 10*3/uL (ref 0.0–0.1)
Basophils Relative: 0.5 % (ref 0.0–3.0)
Eosinophils Absolute: 0.4 10*3/uL (ref 0.0–0.7)
Eosinophils Relative: 4.2 % (ref 0.0–5.0)
HCT: 42.9 % (ref 36.0–46.0)
Hemoglobin: 14 g/dL (ref 12.0–15.0)
Lymphocytes Relative: 27.2 % (ref 12.0–46.0)
Lymphs Abs: 2.7 10*3/uL (ref 0.7–4.0)
MCHC: 32.6 g/dL (ref 30.0–36.0)
MCV: 73 fL — ABNORMAL LOW (ref 78.0–100.0)
Monocytes Absolute: 0.6 10*3/uL (ref 0.1–1.0)
Monocytes Relative: 5.7 % (ref 3.0–12.0)
Neutro Abs: 6.3 10*3/uL (ref 1.4–7.7)
Neutrophils Relative %: 62.4 % (ref 43.0–77.0)
Platelets: 251 10*3/uL (ref 150.0–400.0)
RBC: 5.87 Mil/uL — ABNORMAL HIGH (ref 3.87–5.11)
RDW: 14.5 % (ref 11.5–15.5)
WBC: 10.1 10*3/uL (ref 4.0–10.5)

## 2023-03-23 LAB — COMPREHENSIVE METABOLIC PANEL
ALT: 16 U/L (ref 0–35)
AST: 15 U/L (ref 0–37)
Albumin: 4.9 g/dL (ref 3.5–5.2)
Alkaline Phosphatase: 63 U/L (ref 39–117)
BUN: 8 mg/dL (ref 6–23)
CO2: 24 meq/L (ref 19–32)
Calcium: 9.1 mg/dL (ref 8.4–10.5)
Chloride: 107 meq/L (ref 96–112)
Creatinine, Ser: 0.74 mg/dL (ref 0.40–1.20)
GFR: 98.87 mL/min (ref >60.00)
Glucose, Bld: 97 mg/dL (ref 70–99)
Potassium: 3.6 meq/L (ref 3.5–5.1)
Sodium: 142 meq/L (ref 135–145)
Total Bilirubin: 0.4 mg/dL (ref 0.2–1.2)
Total Protein: 8 g/dL (ref 6.0–8.3)

## 2023-03-23 LAB — VITAMIN B12: Vitamin B-12: 1040 pg/mL — ABNORMAL HIGH (ref 211–911)

## 2023-03-23 MED ORDER — PREDNISONE 10 MG PO TABS: ORAL_TABLET | ORAL | 0 refills | Status: AC

## 2023-03-23 NOTE — Assessment & Plan Note (Addendum)
Blood pressure is stable today. Continue with Nifedipine 30mg  daily. At next appointment in 3 months, will look at changing medication to another blood pressure medication. Ordered CMP.

## 2023-03-23 NOTE — Patient Instructions (Addendum)
-  It was nice to meet you and look forward to taking care of you.  -Blood pressure is stable today. Continue with Nifedipine 30mg  daily. At next appointment in 3 months, will look at changing medication to another blood pressure medication.  -Start taking Prednisone 10mg  tablet, take taper dose over the next 6 days. If back and hip pain do not improve after taking medication, follow up.  -Ordered labs and x-ray. Please come back at 1pm to have these completed. Office will call with results and you will see them on MyChart.  -Follow up in 3 months.

## 2023-03-23 NOTE — Progress Notes (Signed)
New Patient Office Visit  Subjective    Patient ID: Renee Carson, female    DOB: [birth year]  Age: 42 y.o. MRN: [id number]  CC:  Chief Complaint  Patient presents with   Establish Care   Hip Pain    right    HPI Renee Carson presents to establish care with new provider.   Patients previous primary care: None  Specialist: [facility] [employer] at [facility] [place]   HTN: Chronic. Patient is Nifedipine 30mg  daily. GYN was managing, but wanting PCP to manage. She was last seen on 11/11 with a 4 month medication supply. She denies monitoring her blood pressure at home. Denies CP, SHOB, HA, dizziness, lightheadedness, or lower extremity edema.   Patient is complaining of intermittent right hip pain that started in her last third trimester, 4-5 months. Described as pressure, tingling with some numbness in the right foot and toes. Pain usually lasting hours, usually with standing and certain movements. Reports she has right lower back pain.    Outpatient Encounter Medications as of 03/23/2023  Medication Sig   NIFEdipine (PROCARDIA-XL/NIFEDICAL-XL) 30 MG 24 hr tablet Take 1 tablet (30 mg total) by mouth daily.   predniSONE (DELTASONE) 10 MG tablet Take 6 tablets (60 mg total) by mouth daily with breakfast for 1 day, THEN 5 tablets (50 mg total) daily with breakfast for 1 day, THEN 4 tablets (40 mg total) daily with breakfast for 1 day, THEN 3 tablets (30 mg total) daily with breakfast for 1 day, THEN 2 tablets (20 mg total) daily with breakfast for 1 day, THEN 1 tablet (10 mg total) daily with breakfast for 1 day.   [DISCONTINUED] acetaminophen (TYLENOL) 325 MG tablet Take 650 mg by mouth every 6 (six) hours as needed.   [DISCONTINUED] prenatal vitamin w/FE, FA (PRENATAL 1 + 1) 27-1 MG TABS tablet Take 1 tablet by mouth daily at 12 noon.   No facility-administered encounter medications on file as of 03/23/2023.    Past Medical History:  Diagnosis Date   Advanced  maternal age in multigravida, second trimester 04/22/2017   Normal anatomy. Declined cffdna Negative: cmp, tsh, pc ratio.    ASCUS of cervix with negative high risk HPV 05/12/2017      62mo ago     Adequacy  Satisfactory for evaluation  endocervical/transformation zone component PRESENT. Abnormal    Diagnosis  ATYPICAL SQUAMOUS CELLS OF UNDETERMINED SIGNIFICANCE (ASC-US). Abnormal    Chlamydia  Negative   Comment: Normal Reference Range - Negative  Neisseria gonorrhea  Negative   Comment: Normal Reference Range - Negative  HPV  NOT DETECTED   Comment: Normal Reference Range -    Hypertension    resolved   Language barrier 06/02/2011   Postpartum hemorrhage 07/23/2017    Past Surgical History:  Procedure Laterality Date   NO PAST SURGERIES     PR INSERTION DRUG DELIVERY IMPLANT  12/05/2022    Family History  Problem Relation Age of Onset   Diabetes Neg Hx    Hypertension Neg Hx     Social History   Socioeconomic History   Marital status: Married    Spouse name: Leonie Douglas   Number of children: 6   Years of education: Not on file   Highest education level: Not on file  Occupational History   Not on file  Tobacco Use   Smoking status: Never   Smokeless tobacco: Never  Vaping Use   Vaping status: Never Used  Substance and Sexual Activity   Alcohol use: No   Drug use: No   Sexual activity: Not Currently    Birth control/protection: Implant  Other Topics Concern   Not on file  Social History Narrative   Not on file   Social Determinants of Health   Financial Resource Strain: Not on file  Food Insecurity: No Food Insecurity (03/07/2023)   Hunger Vital Sign    Worried About Running Out of Food in the Last Year: Never true    Ran Out of Food in the Last Year: Never true  Transportation Needs: No Transportation Needs (03/07/2023)   PRAPARE - Administrator, Civil Service (Medical): No    Lack of Transportation (Non-Medical): No  Physical Activity: Not on file   Stress: Not on file  Social Connections: Not on file  Intimate Partner Violence: Not on file    ROS See HPI above    Objective   BP 130/80 (BP Location: Right Arm, Patient Position: Sitting, Cuff Size: Normal)   Pulse 94   Temp 98 F (36.7 C) (Oral)   Ht 5\' 4"  (1.626 m)   Wt 125 lb 12.8 oz (57.1 kg)   LMP 02/28/2023 (Exact Date)   SpO2 99%   Breastfeeding Yes   BMI 21.59 kg/m   Physical Exam Vitals reviewed.  Constitutional:      General: She is not in acute distress.    Appearance: Normal appearance. She is not ill-appearing, toxic-appearing or diaphoretic.  HENT:     Head: Normocephalic and atraumatic.  Eyes:     General:        Right eye: No discharge.        Left eye: No discharge.     Conjunctiva/sclera: Conjunctivae normal.  Cardiovascular:     Rate and Rhythm: Normal rate and regular rhythm.     Heart sounds: Normal heart sounds. No murmur heard.    No friction rub. No gallop.  Pulmonary:     Effort: Pulmonary effort is normal. No respiratory distress.     Breath sounds: Normal breath sounds.  Musculoskeletal:        General: Normal range of motion.     Lumbar back: No swelling, deformity or tenderness. Negative right straight leg raise test and negative left straight leg raise test.     Right hip: No deformity, tenderness, bony tenderness or crepitus. Normal range of motion. Normal strength.  Skin:    General: Skin is warm and dry.  Neurological:     General: No focal deficit present.     Mental Status: She is alert and oriented to person, place, and time. Mental status is at baseline.  Psychiatric:        Mood and Affect: Mood normal.        Behavior: Behavior normal.        Thought Content: Thought content normal.        Judgment: Judgment normal.      Assessment & Plan:  Chronic hypertension Assessment & Plan: Blood pressure is stable today. Continue with Nifedipine 30mg  daily. At next appointment in 3 months, will look at changing medication  to another blood pressure medication. Ordered CMP.      Orders: -     Comprehensive metabolic panel  Pain of right hip -     DG Lumbar Spine Complete; Future -     DG HIP UNILAT W OR W/O PELVIS 2-3 VIEWS RIGHT; Future -     predniSONE; Take  6 tablets (60 mg total) by mouth daily with breakfast for 1 day, THEN 5 tablets (50 mg total) daily with breakfast for 1 day, THEN 4 tablets (40 mg total) daily with breakfast for 1 day, THEN 3 tablets (30 mg total) daily with breakfast for 1 day, THEN 2 tablets (20 mg total) daily with breakfast for 1 day, THEN 1 tablet (10 mg total) daily with breakfast for 1 day.  Dispense: 21 tablet; Refill: 0  Numbness and tingling of right lower extremity -     Vitamin B12 -     predniSONE; Take 6 tablets (60 mg total) by mouth daily with breakfast for 1 day, THEN 5 tablets (50 mg total) daily with breakfast for 1 day, THEN 4 tablets (40 mg total) daily with breakfast for 1 day, THEN 3 tablets (30 mg total) daily with breakfast for 1 day, THEN 2 tablets (20 mg total) daily with breakfast for 1 day, THEN 1 tablet (10 mg total) daily with breakfast for 1 day.  Dispense: 21 tablet; Refill: 0  Abnormal laboratory test -     CBC with Differential/Platelet -     Comprehensive metabolic panel  Encounter to establish care   1.Review health maintenance:  -Covid booster: Declines  2.Prescribed Prednisone 10mg  tablet, 6 day taper for continued right lumbar and hip pain; and numbness. Suspect symptoms are related to sciatica. If medication does not help and x-ray are stable, recommend to follow up.  3.Ordered lumbar spine and right hip x-ray for pain in lumbar and hip.  4.Ordered CBC and CMP for abnormal levels from previous visit.     Latest Ref Rng & Units 12/04/2022    5:08 AM 12/03/2022    9:05 AM 12/01/2022   12:15 PM  CBC  WBC 4.0 - 10.5 K/uL 14.5  6.7  5.9   Hemoglobin 12.0 - 15.0 g/dL 10.2  72.5  36.6   Hematocrit 36.0 - 46.0 % 33.7  35.3  36.4   Platelets 150 -  400 K/uL 174  164  174       Chemistry      Component Value Date/Time   NA 137 12/03/2022 0905   NA 139 10/05/2022 1443   K 3.5 12/03/2022 0905   CL 107 12/03/2022 0905   CO2 21 (L) 12/03/2022 0905   BUN 7 12/03/2022 0905   BUN 4 (L) 10/05/2022 1443   CREATININE 0.58 12/03/2022 0905      Component Value Date/Time   CALCIUM 8.8 (L) 12/03/2022 0905   ALKPHOS 191 (H) 12/03/2022 0905   AST 24 12/03/2022 0905   ALT 15 12/03/2022 0905   BILITOT 0.3 12/03/2022 0905   BILITOT 0.3 10/05/2022 1443    5. [place]ek, [place] Interpreter was present at visit.  Return in about 3 months (around 06/23/2023) for chronic management.   Zandra Abts, NP

## 2023-04-01 ENCOUNTER — Ambulatory Visit
Admission: RE | Admit: 2023-04-01 | Discharge: 2023-04-01 | Disposition: A | Discharge status: Home / Self Care | Payer: Medicaid Other | Source: Ambulatory Visit | Attending: Family Medicine

## 2023-04-01 DIAGNOSIS — Z1231 Encounter for screening mammogram for malignant neoplasm of breast: Secondary | ICD-10-CM

## 2023-04-06 ENCOUNTER — Other Ambulatory Visit: Payer: Self-pay | Admitting: Family Medicine

## 2023-04-06 DIAGNOSIS — R928 Other abnormal and inconclusive findings on diagnostic imaging of breast: Secondary | ICD-10-CM

## 2023-04-12 ENCOUNTER — Other Ambulatory Visit: Payer: Self-pay | Admitting: Family Medicine

## 2023-04-12 ENCOUNTER — Ambulatory Visit
Admission: RE | Admit: 2023-04-12 | Discharge: 2023-04-12 | Disposition: A | Discharge status: Home / Self Care | Payer: 59 | Source: Ambulatory Visit | Attending: Family Medicine

## 2023-04-12 DIAGNOSIS — R928 Other abnormal and inconclusive findings on diagnostic imaging of breast: Secondary | ICD-10-CM

## 2023-04-12 DIAGNOSIS — R921 Mammographic calcification found on diagnostic imaging of breast: Secondary | ICD-10-CM

## 2023-04-13 ENCOUNTER — Ambulatory Visit
Admission: RE | Admit: 2023-04-13 | Discharge: 2023-04-13 | Disposition: A | Discharge status: Home / Self Care | Payer: 59 | Source: Ambulatory Visit | Attending: Family Medicine | Admitting: Family Medicine

## 2023-04-13 DIAGNOSIS — R921 Mammographic calcification found on diagnostic imaging of breast: Secondary | ICD-10-CM

## 2023-04-29 ENCOUNTER — Ambulatory Visit
Admission: RE | Admit: 2023-04-29 | Discharge: 2023-04-29 | Disposition: A | Discharge status: Home / Self Care | Payer: 59 | Source: Ambulatory Visit | Attending: Family Medicine | Admitting: Family Medicine

## 2023-04-29 ENCOUNTER — Ambulatory Visit
Admission: RE | Admit: 2023-04-29 | Discharge: 2023-04-29 | Disposition: A | Discharge status: Home / Self Care | Payer: Medicaid Other | Source: Ambulatory Visit | Attending: Family Medicine | Admitting: Family Medicine

## 2023-04-29 DIAGNOSIS — Z603 Acculturation difficulty: Secondary | ICD-10-CM

## 2023-04-29 DIAGNOSIS — R921 Mammographic calcification found on diagnostic imaging of breast: Secondary | ICD-10-CM

## 2023-04-29 HISTORY — PX: BREAST BIOPSY: SHX20

## 2023-05-02 LAB — SURGICAL PATHOLOGY

## 2023-05-30 ENCOUNTER — Encounter: Payer: Self-pay | Admitting: Family Medicine

## 2023-06-04 ENCOUNTER — Other Ambulatory Visit: Payer: Self-pay | Admitting: Family Medicine

## 2023-06-04 DIAGNOSIS — O0990 Supervision of high risk pregnancy, unspecified, unspecified trimester: Secondary | ICD-10-CM

## 2023-06-04 DIAGNOSIS — O163 Unspecified maternal hypertension, third trimester: Secondary | ICD-10-CM

## 2023-06-23 ENCOUNTER — Ambulatory Visit: Payer: 59 | Admitting: Family Medicine

## 2023-06-24 ENCOUNTER — Ambulatory Visit (INDEPENDENT_AMBULATORY_CARE_PROVIDER_SITE_OTHER): Payer: 59 | Admitting: Family Medicine

## 2023-06-24 ENCOUNTER — Encounter: Payer: Self-pay | Admitting: Family Medicine

## 2023-06-24 VITALS — BP 160/100 | HR 83 | Temp 98.5°F | Ht 64.0 in | Wt 130.6 lb

## 2023-06-24 DIAGNOSIS — R200 Anesthesia of skin: Secondary | ICD-10-CM

## 2023-06-24 DIAGNOSIS — R202 Paresthesia of skin: Secondary | ICD-10-CM

## 2023-06-24 DIAGNOSIS — I10 Essential (primary) hypertension: Secondary | ICD-10-CM

## 2023-06-24 MED ORDER — BLOOD PRESSURE KIT: 1.0000 | PACK | Freq: Two times a day (BID) | 0 refills | Status: DC

## 2023-06-24 MED ORDER — AMLODIPINE BESYLATE 5 MG PO TABS: 5.0000 mg | ORAL_TABLET | Freq: Every day | ORAL | 0 refills | Status: DC

## 2023-06-24 MED ORDER — GABAPENTIN 100 MG PO CAPS: 100.0000 mg | ORAL_CAPSULE | Freq: Three times a day (TID) | ORAL | 0 refills | Status: DC

## 2023-06-24 NOTE — Assessment & Plan Note (Addendum)
 Blood pressure is elevated, more likely due to running out of medication and not refilling. Since she has stopped breast feeding, will change blood pressure as we discussed previously. Prescribed Amlodipine 5mg  daily. Recommend to monitor her blood pressure twice a day with an upper blood pressure cuff. Write down readings and bring to her next appointment. Provided written instructions on how to take her blood pressure and about hypertension. She reports her children can read [place] and help her with it. Sent in a prescription for a blood pressure kit, but unsure if insurance will cover. If they do not cover, advised to obtain one at any local pharmacy or [employer]. Prefer [facility].

## 2023-06-24 NOTE — Patient Instructions (Addendum)
-  Blood pressure is high due to running out of medication and not refilling. Since you have stopped breast feeding, will change blood pressure as we discussed previously.  -Start Amlodipine 5mg  tablet. Take 1 tablet daily. -Recommend to monitor your blood pressure twice a day with an upper blood pressure cuff. Write down readings and bring to your next appointment.  -Provided written instructions on how to take your blood pressure and about hypertension. -Sent in a prescription for a blood pressure kit, but unsure if insurance will cover. If they do not cover, you may obtain one at any local pharmacy or [employer]. Prefer [facility].  -Prescribed Gabapentin 100mg  tablet up to 3 times a day for numbness and tingling.  -Follow up in 2 weeks.

## 2023-06-24 NOTE — Progress Notes (Signed)
 Established Patient Office Visit   Subjective:  Patient ID: Renee Carson, female    DOB: [birth year]  Age: 43 y.o. MRN: [id number]  Chief Complaint  Patient presents with   Medical Management of Chronic Issues    Pt is here for her 3 months f/u.    Numbness    Pt c/o numbness of R leg. Ongoing issues.     HPI HTN: Chronic. Patient was prescribed Nifedipine 30mg  daily. GYN was managing, but wanting PCP to manage. She has not been taking medication for about 2 weeks because she ran out of medication and didn't know how to call for a refill.  She denies monitoring her blood pressure at home. Denies CP, SHOB, HA, dizziness, lightheadedness, or lower extremity edema.   At previous visit, patient complained about intermittent right hip pain that started in her last third trimester during last year.  Described as pressure, tingling with some numbness in the right foot and toes. Pain usually lasting hours, usually with standing and certain movements. Reports she has right lower back pain. On previous visit, she was prescribed Prednisone. She reports her right hip pain improved, but still has lumbar pain with right foot numbness and tingling. Her vitamin B12 was normal in November.   ROS See HPI above     Objective:   BP (!) 160/100 (BP Location: Right Arm, Patient Position: Sitting, Cuff Size: Normal)   Pulse 83   Temp 98.5 F (36.9 C) (Oral)   Ht 5\' 4"  (1.626 m)   Wt 130 lb 9.6 oz (59.2 kg)   SpO2 99%   Breastfeeding No   BMI 22.42 kg/m    Physical Exam Vitals reviewed.  Constitutional:      General: She is not in acute distress.    Appearance: Normal appearance. She is not ill-appearing, toxic-appearing or diaphoretic.  HENT:     Head: Normocephalic and atraumatic.  Eyes:     General:        Right eye: No discharge.        Left eye: No discharge.     Conjunctiva/sclera: Conjunctivae normal.  Cardiovascular:     Rate and Rhythm: Normal rate and regular rhythm.     Heart  sounds: Normal heart sounds. No murmur heard.    No friction rub. No gallop.  Pulmonary:     Effort: Pulmonary effort is normal. No respiratory distress.     Breath sounds: Normal breath sounds.  Musculoskeletal:        General: Normal range of motion.  Skin:    General: Skin is warm and dry.  Neurological:     General: No focal deficit present.     Mental Status: She is alert and oriented to person, place, and time. Mental status is at baseline.  Psychiatric:        Mood and Affect: Mood normal.        Behavior: Behavior normal.        Thought Content: Thought content normal.        Judgment: Judgment normal.      Assessment & Plan:  Chronic hypertension Assessment & Plan: Blood pressure is elevated, more likely due to running out of medication and not refilling. Since she has stopped breast feeding, will change blood pressure as we discussed previously. Prescribed Amlodipine 5mg  daily. Recommend to monitor her blood pressure twice a day with an upper blood pressure cuff. Write down readings and bring to her next appointment. Provided written instructions on how  to take her blood pressure and about hypertension. She reports her children can read [place] and help her with it. Sent in a prescription for a blood pressure kit, but unsure if insurance will cover. If they do not cover, advised to obtain one at any local pharmacy or [facility]. Prefer [facility].   Orders: -     amLODIPine Besylate; Take 1 tablet (5 mg total) by mouth daily.  Dispense: 90 tablet; Refill: 0 -     Blood Pressure; 1 each by Does not apply route 2 (two) times daily.  Dispense: 1 kit; Refill: 0  Numbness and tingling of right lower extremity -     Gabapentin; Take 1 capsule (100 mg total) by mouth 3 (three) times daily.  Dispense: 90 capsule; Refill: 0  -Patient had a lumbar spine x-ray back in 02/2023 with no findings. Her right foot numbness and tingling could be coming from her lumbar spine. Offered to refer to  orthopedic, but declined. For now, will start with Gabapentin to see if this helps. Also, in November, ruled out vitamin B12 deficiency. -Interpreter: Vung Ksor was present for visit.  Return in about 2 weeks (around 07/08/2023) for follow-up.   Zandra Abts, NP

## 2023-07-08 ENCOUNTER — Ambulatory Visit: Payer: 59 | Admitting: Family Medicine

## 2023-07-14 NOTE — Progress Notes (Unsigned)
   Established Patient Office Visit   Subjective:  Patient ID: Renee Carson, female    DOB: 10-02-1980  Age: 43 y.o. MRN: [id number]  No chief complaint on file.   HPI HTN: Chronic. On previous appointment, 06/24/2023, she started Amlodipine 5mg  and transitioned from Nifedipine 30mg  daily. Patient was suppose to monitor her blood pressure reading and bring them to this appointment.   Numbness and tingling in right lower extremity: On previous appointment, she started taking Gabapentin 100mg  TID.  ROS See HPI above     Objective:     There were no vitals taken for this visit. {Vitals History (Optional):23777}  Physical Exam  No results found for any visits on 07/15/23.  The ASCVD Risk score (Arnett DK, et al., 2019) failed to calculate for the following reasons:   Cannot find a previous HDL lab   Cannot find a previous total cholesterol lab    Assessment & Plan:  There are no diagnoses linked to this encounter.  No follow-ups on file.   Zandra Abts, NP

## 2023-07-15 ENCOUNTER — Ambulatory Visit (INDEPENDENT_AMBULATORY_CARE_PROVIDER_SITE_OTHER): Admitting: Family Medicine

## 2023-07-15 ENCOUNTER — Encounter: Payer: Self-pay | Admitting: Family Medicine

## 2023-07-15 VITALS — BP 150/100 | HR 95 | Temp 98.1°F | Wt 128.0 lb

## 2023-07-15 DIAGNOSIS — R202 Paresthesia of skin: Secondary | ICD-10-CM | POA: Diagnosis not present

## 2023-07-15 DIAGNOSIS — I10 Essential (primary) hypertension: Secondary | ICD-10-CM | POA: Diagnosis not present

## 2023-07-15 DIAGNOSIS — R200 Anesthesia of skin: Secondary | ICD-10-CM

## 2023-07-15 MED ORDER — GABAPENTIN 300 MG PO CAPS: 300.0000 mg | ORAL_CAPSULE | Freq: Three times a day (TID) | ORAL | 0 refills | Status: DC

## 2023-07-15 MED ORDER — AMLODIPINE BESYLATE 10 MG PO TABS: 10.0000 mg | ORAL_TABLET | Freq: Every day | ORAL | Status: DC

## 2023-07-15 NOTE — Patient Instructions (Addendum)
-  Blood pressure is not at goal. Increase Amlodipine to 10mg  from 5 mg daily. Recommend to take (2) 5mg  tablets to equal 10mg  daily from your medication you already have.  -Increase Gabapentin to 300mg  three times a day from 100mg  three times a day. Take (3) 100mg  tablets to equal 300mg  three times a day. When you get the new prescription, only take 1 tablet three times a day.  -Follow up in 2 weeks.

## 2023-07-15 NOTE — Assessment & Plan Note (Signed)
-  Blood pressure is not at goal. Increase Amlodipine to 10mg  from 5 mg daily. Recommend to take (2) 5mg  tablets to equal 10mg  daily from her medication she already have.

## 2023-08-01 ENCOUNTER — Ambulatory Visit (INDEPENDENT_AMBULATORY_CARE_PROVIDER_SITE_OTHER): Admitting: Family Medicine

## 2023-08-01 ENCOUNTER — Encounter: Payer: Self-pay | Admitting: Family Medicine

## 2023-08-01 VITALS — BP 142/92 | HR 97 | Temp 97.7°F | Wt 129.0 lb

## 2023-08-01 DIAGNOSIS — R200 Anesthesia of skin: Secondary | ICD-10-CM

## 2023-08-01 DIAGNOSIS — I10 Essential (primary) hypertension: Secondary | ICD-10-CM | POA: Diagnosis not present

## 2023-08-01 DIAGNOSIS — R202 Paresthesia of skin: Secondary | ICD-10-CM | POA: Diagnosis not present

## 2023-08-01 DIAGNOSIS — R252 Cramp and spasm: Secondary | ICD-10-CM

## 2023-08-01 LAB — COMPREHENSIVE METABOLIC PANEL WITH GFR
ALT: 15 U/L (ref 0–35)
AST: 16 U/L (ref 0–37)
Albumin: 4.9 g/dL (ref 3.5–5.2)
Alkaline Phosphatase: 57 U/L (ref 39–117)
BUN: 9 mg/dL (ref 6–23)
CO2: 27 meq/L (ref 19–32)
Calcium: 9.2 mg/dL (ref 8.4–10.5)
Chloride: 104 meq/L (ref 96–112)
Creatinine, Ser: 0.61 mg/dL (ref 0.40–1.20)
GFR: 108.98 mL/min (ref >60.00)
Glucose, Bld: 112 mg/dL — ABNORMAL HIGH (ref 70–99)
Potassium: 3.7 meq/L (ref 3.5–5.1)
Sodium: 140 meq/L (ref 135–145)
Total Bilirubin: 0.4 mg/dL (ref 0.2–1.2)
Total Protein: 8.1 g/dL (ref 6.0–8.3)

## 2023-08-01 LAB — MAGNESIUM: Magnesium: 2.3 mg/dL (ref 1.5–2.5)

## 2023-08-01 LAB — VITAMIN B12: Vitamin B-12: 706 pg/mL (ref 211–911)

## 2023-08-01 MED ORDER — BLOOD PRESSURE KIT: 1.0000 | PACK | Freq: Two times a day (BID) | 0 refills | Status: AC

## 2023-08-01 MED ORDER — AMLODIPINE BESYLATE 10 MG PO TABS: 10.0000 mg | ORAL_TABLET | Freq: Every day | ORAL | 0 refills | Status: DC

## 2023-08-01 NOTE — Assessment & Plan Note (Signed)
 Continue taking Amlodipine (2) 5 mg tablets to equal 10mg  daily. Refilled Amlodipine 10mg  tablet daily. Blood pressure is not at goal. Debated on adding medication. However, patient would like to see about obtaining BP machine to see what it is at home. She reports she gets very nervous during her appointments. Sent in another prescription for a blood pressure kit. She reports she is going to the pharmacy to pick up. Provided blank BP form for her to write down her readings. If still elevated at next appointment, will add Olmesartan 10mg  daily.  Ordered CMP to assess kidney function.

## 2023-08-01 NOTE — Patient Instructions (Signed)
-  Ordered lab. Office will call with results and you will see them on MyChart. -Continue taking Amlodipine (2) 5 mg tablets to equal 10mg  daily. Refilled Amlodipine 10mg  tablet daily. When you go to the pharmacy to pick up your new prescription, only take 1 pill daily.  -Sent in a prescription for a blood pressure kit. Please go to the pharmacy to get blood pressure kit. Take your blood pressure twice a day, once in the morning and once in the evening. Write down your readings on the form provided and bring to your next appointment.  -Continue Gabapentin 300mg  tablet, 1 tablet three times a day.  -Follow up in 2 weeks.

## 2023-08-01 NOTE — Progress Notes (Signed)
 Established Patient Office Visit   Subjective:  Patient ID: Renee Carson, female    DOB: 07-15-1980  Age: 43 y.o. MRN: [id number]  Chief Complaint  Patient presents with   Medical Management of Chronic Issues   Hypertension    HPI:  HTN: Chronic. On last appointment, increased Amlodipine to 10mg  from 5 mg daily. She is taking (2) 5 mg tablets at 7am every morning. Denies monitoring her blood pressure because she does not have a cuff. A blood pressure machine has been ordered to the pharmacy. Denies CP, SHOB, HA, or lower extremity edema. She reports she becomes nervious when she is at her appointments.    Numbness and tingling of right lower extremities: At previous appointment, increased her Gabapentin to 300mg  TID from 100mg  TID. She has had a lumbar spine and right hip x-ray that was normal. Initially, she was having pain in lumbar spine/right hip, but feels better. Also, had numbness and tingling down her right lower extremity, but this has improved with taking Gabapentin. Now, she is complaining of numbness and tingling with cramps on her whole right foot.  ROS See HPI above     Objective:   BP (!) 142/92 (BP Location: Right Arm, Patient Position: Sitting, Cuff Size: Normal)   Pulse 97   Temp 97.7 F (36.5 C) (Oral)   Wt 129 lb (58.5 kg)   LMP  (LMP Unknown)   SpO2 98%   BMI 22.14 kg/m    Physical Exam Vitals reviewed.  Constitutional:      General: She is not in acute distress.    Appearance: Normal appearance. She is not ill-appearing, toxic-appearing or diaphoretic.  Eyes:     General:        Right eye: No discharge.        Left eye: No discharge.     Conjunctiva/sclera: Conjunctivae normal.  Cardiovascular:     Rate and Rhythm: Normal rate and regular rhythm.     Pulses:          Dorsalis pedis pulses are 3+ on the right side.     Heart sounds: Normal heart sounds. No murmur heard.    No friction rub. No gallop.  Pulmonary:     Effort: Pulmonary effort is  normal. No respiratory distress.     Breath sounds: Normal breath sounds.  Musculoskeletal:        General: Normal range of motion.     Right lower leg: No edema.     Left lower leg: No edema.  Skin:    General: Skin is warm and dry.  Neurological:     General: No focal deficit present.     Mental Status: She is alert and oriented to person, place, and time. Mental status is at baseline.  Psychiatric:        Mood and Affect: Mood normal.        Behavior: Behavior normal.        Thought Content: Thought content normal.        Judgment: Judgment normal.      Assessment & Plan:  Chronic hypertension Assessment & Plan: Continue taking Amlodipine (2) 5 mg tablets to equal 10mg  daily. Refilled Amlodipine 10mg  tablet daily. Blood pressure is not at goal. Debated on adding medication. However, patient would like to see about obtaining BP machine to see what it is at home. She reports she gets very nervous during her appointments. Sent in another prescription for a blood pressure kit. She reports  she is going to the pharmacy to pick up. Provided blank BP form for her to write down her readings. If still elevated at next appointment, will add Olmesartan 10mg  daily.  Ordered CMP to assess kidney function.   Orders: -     Comprehensive metabolic panel with GFR -     amLODIPine Besylate; Take 1 tablet (10 mg total) by mouth daily.  Dispense: 90 tablet; Refill: 0 -     Blood Pressure; 1 each by Does not apply route 2 (two) times daily.  Dispense: 1 kit; Refill: 0  Numbness and tingling of right lower extremity -     Vitamin B12  Muscle cramps -     Magnesium -     Comprehensive metabolic panel with GFR  -Continue with Gabapentin 300mg  TID since it is helping.  -Ordered CMP, magnesium, and Vitamin B12 to for complaints of continued numbness/tingling and muscle cramps.  Elissa Lovett interpreter: Francesco Sor  Return in about 2 weeks (around 08/15/2023) for follow-up.   Zandra Abts,  NP

## 2023-08-15 ENCOUNTER — Ambulatory Visit: Admitting: Family Medicine

## 2023-08-16 ENCOUNTER — Encounter: Payer: Self-pay | Admitting: Family Medicine

## 2023-08-16 ENCOUNTER — Ambulatory Visit (INDEPENDENT_AMBULATORY_CARE_PROVIDER_SITE_OTHER): Admitting: Family Medicine

## 2023-08-16 VITALS — BP 138/96 | HR 90 | Temp 98.1°F | Ht 64.0 in | Wt 130.0 lb

## 2023-08-16 DIAGNOSIS — I10 Essential (primary) hypertension: Secondary | ICD-10-CM

## 2023-08-16 MED ORDER — OLMESARTAN MEDOXOMIL 20 MG PO TABS: 20.0000 mg | ORAL_TABLET | Freq: Every day | ORAL | 0 refills | Status: DC

## 2023-08-16 NOTE — Progress Notes (Signed)
   Established Patient Office Visit   Subjective:  Patient ID: Renee Carson, female    DOB: 07-04-1980  Age: 43 y.o. MRN: [id number]  Chief Complaint  Patient presents with   Medical Management of Chronic Issues   Muscle Pain   Numbness    HPI HTN: Blood pressure is not at goal. Debated on adding medication at last visit. Patient is taking Amlodipine  10mg  daily. However, patient wanted to see about obtaining BP machine to see what her BP was at home. She reports she gets very nervous during her appointments. Patient has brought in readings, only 6 readings. Ranging 124-140/83-98.  ROS See HPI above     Objective:   BP (!) 138/96   Pulse 90   Temp 98.1 F (36.7 C) (Oral)   Ht 5\' 4"  (1.626 m)   Wt 130 lb (59 kg)   SpO2 98%   BMI 22.31 kg/m    Physical Exam Vitals reviewed.  Constitutional:      General: She is not in acute distress.    Appearance: Normal appearance. She is not ill-appearing, toxic-appearing or diaphoretic.  HENT:     Head: Normocephalic and atraumatic.  Eyes:     General:        Right eye: No discharge.        Left eye: No discharge.     Conjunctiva/sclera: Conjunctivae normal.  Cardiovascular:     Rate and Rhythm: Normal rate.  Pulmonary:     Effort: Pulmonary effort is normal. No respiratory distress.  Musculoskeletal:        General: Normal range of motion.  Skin:    General: Skin is warm and dry.  Neurological:     General: No focal deficit present.     Mental Status: She is alert and oriented to person, place, and time. Mental status is at baseline.  Psychiatric:        Mood and Affect: Mood normal.        Behavior: Behavior normal.        Thought Content: Thought content normal.        Judgment: Judgment normal.      Assessment & Plan:  Chronic hypertension Assessment & Plan: -Blood pressure is still elevated. Add Olmesartan  20mg  tablet, take 1 tablet daily. Recommend to monitor blood pressure again twice a day, once in the AM and once  in PM. Bring readings in 2 weeks.  -Follow up in 2 weeks.  -Kidney function was stable at last appointment.   Orders: -     Olmesartan  Medoxomil; Take 1 tablet (20 mg total) by mouth daily.  Dispense: 30 tablet; Refill: 0  -Renee Carson interpreter: Renee Carson  Return in about 2 weeks (around 08/30/2023) for follow-up.   [name], NP

## 2023-08-16 NOTE — Patient Instructions (Addendum)
-  Blood pressure is still elevated. Add Olmesartan  20mg  tablet, take 1 tablet daily. Recommend to monitor blood pressure again twice a day, once in the AM and once in PM. Bring readings in 2 weeks.  -Follow up in 2 weeks.

## 2023-08-16 NOTE — Assessment & Plan Note (Addendum)
-  Blood pressure is still elevated. Add Olmesartan  20mg  tablet, take 1 tablet daily. Recommend to monitor blood pressure again twice a day, once in the AM and once in PM. Bring readings in 2 weeks.  -Follow up in 2 weeks.  -Kidney function was stable at last appointment.

## 2023-08-30 ENCOUNTER — Encounter: Payer: Self-pay | Admitting: Family Medicine

## 2023-08-30 ENCOUNTER — Ambulatory Visit (INDEPENDENT_AMBULATORY_CARE_PROVIDER_SITE_OTHER): Admitting: Family Medicine

## 2023-08-30 DIAGNOSIS — I10 Essential (primary) hypertension: Secondary | ICD-10-CM | POA: Diagnosis not present

## 2023-08-30 MED ORDER — AMLODIPINE BESYLATE 10 MG PO TABS: 10.0000 mg | ORAL_TABLET | Freq: Every day | ORAL | 1 refills | Status: DC

## 2023-08-30 MED ORDER — OLMESARTAN MEDOXOMIL 20 MG PO TABS: 20.0000 mg | ORAL_TABLET | Freq: Every day | ORAL | 1 refills | Status: DC

## 2023-08-30 NOTE — Assessment & Plan Note (Signed)
 Blood pressure is controlled. Continue taking Olmesartan  20mg  daily and Amlodipine  10mg  daily. Refilled medications.

## 2023-08-30 NOTE — Patient Instructions (Addendum)
-  Blood pressure is controlled. Continue taking Olmesartan  20mg  daily and Amlodipine  10mg  daily. Refilled medications. -Follow up in 6 months for a physical.

## 2023-08-30 NOTE — Progress Notes (Signed)
   Established Patient Office Visit   Subjective:  Patient ID: Renee Carson, female    DOB: [birth year]  Age: 43 y.o. MRN: [id number]  Chief Complaint  Patient presents with   Medical Management of Chronic Issues    Hypertension     HPI HTN: Chronic. On previous appointment, added Olmesartan  20mg  daily to her Amlodipine  10mg  daily. Patient brought in BP readings, ranging 102-125/76-80. Denies any CP, SHOB, HA, dizziness, lightheadedness, or lower extremity edema.  ROS See HPI above     Objective:   BP 124/82   Pulse 91   Temp 98.1 F (36.7 C) (Oral)   Ht 5\' 4"  (1.626 m)   Wt 128 lb (58.1 kg)   LMP  (LMP Unknown)   SpO2 98%   BMI 21.97 kg/m    Physical Exam Vitals reviewed.  Constitutional:      General: She is not in acute distress.    Appearance: Normal appearance. She is not ill-appearing, toxic-appearing or diaphoretic.  HENT:     Head: Normocephalic and atraumatic.  Eyes:     General:        Right eye: No discharge.        Left eye: No discharge.     Conjunctiva/sclera: Conjunctivae normal.  Cardiovascular:     Rate and Rhythm: Normal rate and regular rhythm.     Heart sounds: Normal heart sounds. No murmur heard.    No friction rub. No gallop.  Pulmonary:     Effort: Pulmonary effort is normal. No respiratory distress.     Breath sounds: Normal breath sounds.  Musculoskeletal:        General: Normal range of motion.  Skin:    General: Skin is warm and dry.  Neurological:     General: No focal deficit present.     Mental Status: She is alert and oriented to person, place, and time. Mental status is at baseline.  Psychiatric:        Mood and Affect: Mood normal.        Behavior: Behavior normal.        Thought Content: Thought content normal.        Judgment: Judgment normal.      Assessment & Plan:  Chronic hypertension Assessment & Plan: Blood pressure is controlled. Continue taking Olmesartan  20mg  daily and Amlodipine  10mg  daily. Refilled  medications.  Orders: -     Olmesartan  Medoxomil; Take 1 tablet (20 mg total) by mouth daily.  Dispense: 90 tablet; Refill: 1 -     amLODIPine  Besylate; Take 1 tablet (10 mg total) by mouth daily.  Dispense: 90 tablet; Refill: 1  -[place] interpreter for whole visit: Lek.   Return in about 6 months (around 03/01/2024) for physical.   Kele Withem, NP

## 2023-12-13 ENCOUNTER — Other Ambulatory Visit: Payer: Self-pay | Admitting: Family Medicine

## 2023-12-13 DIAGNOSIS — I10 Essential (primary) hypertension: Secondary | ICD-10-CM

## 2024-03-02 ENCOUNTER — Ambulatory Visit: Admitting: Family Medicine

## 2024-03-02 ENCOUNTER — Encounter: Payer: Self-pay | Admitting: Family Medicine

## 2024-03-02 VITALS — BP 128/88 | HR 91 | Temp 98.4°F | Ht 64.0 in | Wt 121.0 lb

## 2024-03-02 DIAGNOSIS — R200 Anesthesia of skin: Secondary | ICD-10-CM | POA: Diagnosis not present

## 2024-03-02 DIAGNOSIS — R7309 Other abnormal glucose: Secondary | ICD-10-CM

## 2024-03-02 DIAGNOSIS — M25551 Pain in right hip: Secondary | ICD-10-CM

## 2024-03-02 DIAGNOSIS — Z23 Encounter for immunization: Secondary | ICD-10-CM

## 2024-03-02 DIAGNOSIS — Z0000 Encounter for general adult medical examination without abnormal findings: Secondary | ICD-10-CM

## 2024-03-02 DIAGNOSIS — R252 Cramp and spasm: Secondary | ICD-10-CM

## 2024-03-02 DIAGNOSIS — I10 Essential (primary) hypertension: Secondary | ICD-10-CM | POA: Diagnosis not present

## 2024-03-02 DIAGNOSIS — Z13220 Encounter for screening for lipoid disorders: Secondary | ICD-10-CM

## 2024-03-02 DIAGNOSIS — Z136 Encounter for screening for cardiovascular disorders: Secondary | ICD-10-CM

## 2024-03-02 DIAGNOSIS — R202 Paresthesia of skin: Secondary | ICD-10-CM

## 2024-03-02 LAB — CBC WITH DIFFERENTIAL/PLATELET
Basophils Absolute: 0.1 K/uL (ref 0.0–0.1)
Basophils Relative: 0.8 % (ref 0.0–3.0)
Eosinophils Absolute: 0.4 K/uL (ref 0.0–0.7)
Eosinophils Relative: 5.4 % — ABNORMAL HIGH (ref 0.0–5.0)
HCT: 43.1 % (ref 36.0–46.0)
Hemoglobin: 14.2 g/dL (ref 12.0–15.0)
Lymphocytes Relative: 27.8 % (ref 12.0–46.0)
Lymphs Abs: 2 K/uL (ref 0.7–4.0)
MCHC: 33 g/dL (ref 30.0–36.0)
MCV: 73.9 fl — ABNORMAL LOW (ref 78.0–100.0)
Monocytes Absolute: 0.5 K/uL (ref 0.1–1.0)
Monocytes Relative: 6.7 % (ref 3.0–12.0)
Neutro Abs: 4.3 K/uL (ref 1.4–7.7)
Neutrophils Relative %: 59.3 % (ref 43.0–77.0)
Platelets: 193 K/uL (ref 150.0–400.0)
RBC: 5.83 Mil/uL — ABNORMAL HIGH (ref 3.87–5.11)
RDW: 13.4 % (ref 11.5–15.5)
WBC: 7.3 K/uL (ref 4.0–10.5)

## 2024-03-02 LAB — LIPID PANEL
Cholesterol: 168 mg/dL (ref 0–200)
HDL: 54.5 mg/dL (ref >39.00)
LDL Cholesterol: 90 mg/dL (ref 0–99)
NonHDL: 113.53
Total CHOL/HDL Ratio: 3
Triglycerides: 120 mg/dL (ref 0.0–149.0)
VLDL: 24 mg/dL (ref 0.0–40.0)

## 2024-03-02 LAB — COMPREHENSIVE METABOLIC PANEL WITH GFR
ALT: 13 U/L (ref 0–35)
AST: 14 U/L (ref 0–37)
Albumin: 5 g/dL (ref 3.5–5.2)
Alkaline Phosphatase: 45 U/L (ref 39–117)
BUN: 11 mg/dL (ref 6–23)
CO2: 27 meq/L (ref 19–32)
Calcium: 9.2 mg/dL (ref 8.4–10.5)
Chloride: 104 meq/L (ref 96–112)
Creatinine, Ser: 0.62 mg/dL (ref 0.40–1.20)
GFR: 108.11 mL/min (ref >60.00)
Glucose, Bld: 103 mg/dL — ABNORMAL HIGH (ref 70–99)
Potassium: 3.7 meq/L (ref 3.5–5.1)
Sodium: 140 meq/L (ref 135–145)
Total Bilirubin: 0.6 mg/dL (ref 0.2–1.2)
Total Protein: 8 g/dL (ref 6.0–8.3)

## 2024-03-02 LAB — TSH: TSH: 1.6 u[IU]/mL (ref 0.35–5.50)

## 2024-03-02 LAB — HEMOGLOBIN A1C: Hgb A1c MFr Bld: 5.7 % (ref 4.6–6.5)

## 2024-03-02 MED ORDER — AMLODIPINE BESYLATE 10 MG PO TABS
10.0000 mg | ORAL_TABLET | Freq: Every day | ORAL | 1 refills | Status: AC
Start: 2024-03-02 — End: 2024-05-31

## 2024-03-02 MED ORDER — GABAPENTIN 300 MG PO CAPS: 300.0000 mg | ORAL_CAPSULE | Freq: Three times a day (TID) | ORAL | 0 refills | Status: AC

## 2024-03-02 NOTE — Progress Notes (Signed)
 Complete physical exam  Patient: Renee Carson   DOB: Feb 23, 1981   43 y.o. Female  MRN: [id number]  Subjective:    Chief Complaint  Patient presents with   Annual Exam    Renee Carson is a 43 y.o. female who presents today for a complete physical exam. She reports consuming a general diet. The patient has a physically strenuous job, but has no regular exercise apart from work.  She generally feels well. She reports sleeping well. She does not have additional problems to discuss today.    Most recent fall risk assessment:    03/02/2024   10:29 AM  Fall Risk   Falls in the past year? 0  Number falls in past yr: 0  Injury with Fall? 0  Risk for fall due to : No Fall Risks  Follow up Falls evaluation completed     Most recent depression screenings:    03/02/2024   10:29 AM 03/07/2023    5:01 PM  PHQ 2/9 Scores  PHQ - 2 Score 0 0  PHQ- 9 Score 0 0      Data saved with a previous flowsheet row definition    Vision: Not within the last year Dental: Denies regular dental care   Past Medical History:  Diagnosis Date   Advanced maternal age in multigravida, second trimester 04/22/2017   Normal anatomy. Declined cffdna Negative: cmp, tsh, pc ratio.    ASCUS of cervix with negative high risk HPV 05/12/2017      36mo ago     Adequacy  Satisfactory for evaluation  endocervical/transformation zone component PRESENT. Abnormal    Diagnosis  ATYPICAL SQUAMOUS CELLS OF UNDETERMINED SIGNIFICANCE (ASC-US ). Abnormal    Chlamydia  Negative   Comment: Normal Reference Range - Negative  Neisseria gonorrhea  Negative   Comment: Normal Reference Range - Negative  HPV  NOT DETECTED   Comment: Normal Reference Range -    Hypertension    resolved   Language barrier 06/02/2011   Postpartum hemorrhage 07/23/2017   Past Surgical History:  Procedure Laterality Date   BREAST BIOPSY Left 04/29/2023   MM LT BREAST BX W LOC DEV EA AD LESION IMG BX SPEC STEREO GUIDE 04/29/2023 [facility]   BREAST  BIOPSY Left 04/29/2023   MM LT BREAST BX W LOC DEV 1ST LESION IMAGE BX SPEC STEREO GUIDE 04/29/2023 [facility]   NO PAST SURGERIES     PR INSERTION DRUG DELIVERY IMPLANT  12/05/2022   Social History   Tobacco Use   Smoking status: Never   Smokeless tobacco: Never  Vaping Use   Vaping status: Never Used  Substance Use Topics   Alcohol use: No   Drug use: No   Social History   Socioeconomic History   Marital status: Married    Spouse name: Gerold Cirri   Number of children: 6   Years of education: Not on file   Highest education level: Not on file  Occupational History   Not on file  Tobacco Use   Smoking status: Never   Smokeless tobacco: Never  Vaping Use   Vaping status: Never Used  Substance and Sexual Activity   Alcohol use: No   Drug use: No   Sexual activity: Not Currently    Birth control/protection: Implant  Other Topics Concern   Not on file  Social History Narrative   Not on file   Social Drivers of Health   Financial Resource Strain: Not on file  Food Insecurity:  No Food Insecurity (03/07/2023)   Hunger Vital Sign    Worried About Running Out of Food in the Last Year: Never true    Ran Out of Food in the Last Year: Never true  Transportation Needs: No Transportation Needs (03/07/2023)   PRAPARE - Administrator, Civil Service (Medical): No    Lack of Transportation (Non-Medical): No  Physical Activity: Not on file  Stress: Not on file  Social Connections: Not on file  Intimate Partner Violence: Not on file   Family Status  Relation Name Status   Mother  Alive   Father  Alive   Sister  Alive   Sister  Alive   Sister  Alive   Sister  Alive   Daughter  Alive   Daughter  Alive   Daughter  Alive   MGM  Deceased   MGF  Deceased   PGM  Deceased   PGF  Deceased   Brother  Alive   Brother  Alive   Brother  Alive   Brother  Alive   Son  Alive   Son  Alive   Son  Alive   Neg Hx  (Not Specified)  No partnership data on file    Family History  Problem Relation Age of Onset   Diabetes Neg Hx    Hypertension Neg Hx    BRCA 1/2 Neg Hx    Breast cancer Neg Hx    No Known Allergies    Patient Care Team: Billy Philippe SAUNDERS, NP as PCP - General (Family Medicine)   Outpatient Medications Prior to Visit  Medication Sig   Blood Pressure KIT 1 each by Does not apply route 2 (two) times daily.   olmesartan  (BENICAR ) 20 MG tablet TAKE 1 TABLET BY MOUTH EVERY DAY   Prenatal Vit-Fe Fumarate-FA (M-NATAL PLUS) 27-1 MG TABS Take 1 tablet by mouth daily.   [DISCONTINUED] amLODipine  (NORVASC ) 10 MG tablet Take 1 tablet (10 mg total) by mouth daily.   [DISCONTINUED] gabapentin  (NEURONTIN ) 300 MG capsule Take 1 capsule (300 mg total) by mouth 3 (three) times daily.   No facility-administered medications prior to visit.    Review of Systems  Musculoskeletal:  Positive for joint pain (Right hip).  Neurological:  Positive for tingling (Numbness/Tingling Extremity).   See HPI above     Objective:   BP 128/88   Pulse 91   Temp 98.4 F (36.9 C) (Oral)   Ht 5' 4 (1.626 m)   Wt 121 lb (54.9 kg)   SpO2 91%   BMI 20.77 kg/m  BP Readings from Last 3 Encounters:  03/02/24 128/88  08/30/23 124/82  08/16/23 (!) 138/96      Physical Exam Vitals reviewed.  Constitutional:      General: She is not in acute distress.    Appearance: Normal appearance. She is not ill-appearing or toxic-appearing.  HENT:     Head: Normocephalic and atraumatic.     Right Ear: Tympanic membrane, ear canal and external ear normal. There is no impacted cerumen.     Left Ear: Tympanic membrane, ear canal and external ear normal. There is no impacted cerumen.     Nose:     Right Sinus: No maxillary sinus tenderness or frontal sinus tenderness.     Left Sinus: No maxillary sinus tenderness or frontal sinus tenderness.     Mouth/Throat:     Mouth: Mucous membranes are moist.     Pharynx: Oropharynx is clear. Uvula midline. No pharyngeal  swelling, oropharyngeal exudate, posterior oropharyngeal erythema or uvula swelling.  Eyes:     General:        Right eye: No discharge.        Left eye: No discharge.     Conjunctiva/sclera: Conjunctivae normal.     Pupils: Pupils are equal, round, and reactive to light.  Neck:     Thyroid: No thyromegaly.  Cardiovascular:     Rate and Rhythm: Normal rate and regular rhythm.     Pulses:          Posterior tibial pulses are 2+ on the right side and 2+ on the left side.     Heart sounds: Normal heart sounds. No murmur heard.    No friction rub. No gallop.  Pulmonary:     Effort: Pulmonary effort is normal. No respiratory distress.     Breath sounds: Normal breath sounds.  Abdominal:     General: Abdomen is flat. Bowel sounds are normal. There is no distension.     Palpations: Abdomen is soft. There is no mass.     Tenderness: There is no abdominal tenderness.  Musculoskeletal:        General: Normal range of motion.     Cervical back: Normal range of motion.     Right lower leg: No edema.     Left lower leg: No edema.  Lymphadenopathy:     Cervical: No cervical adenopathy.  Skin:    General: Skin is warm and dry.  Neurological:     General: No focal deficit present.     Mental Status: She is alert and oriented to person, place, and time. Mental status is at baseline.     Motor: No weakness.     Gait: Gait normal.  Psychiatric:        Mood and Affect: Mood normal.        Behavior: Behavior normal.        Thought Content: Thought content normal.        Judgment: Judgment normal.        Assessment & Plan:    Routine Health Maintenance and Physical Exam  Immunization History  Administered Date(s) Administered   Influenza, Seasonal, Injecte, Preservative Fre 03/07/2023, 03/02/2024   Influenza,inj,Quad PF,6+ Mos 07/24/2017   Tdap 01/31/2013, 06/22/2013, 05/12/2017, 11/12/2022    Health Maintenance  Topic Date Due   Hepatitis B Vaccines 19-59 Average Risk (1 of 3 -  19+ 3-dose series) Never done   HPV VACCINES (1 - 3-dose SCDM series) Never done   COVID-19 Vaccine (1 - 2025-26 season) Never done   Mammogram  04/11/2025   Cervical Cancer Screening (HPV/Pap Cotest)  01/13/2028   DTaP/Tdap/Td (5 - Td or Tdap) 11/11/2032   Influenza Vaccine  Completed   Hepatitis C Screening  Completed   HIV Screening  Completed   Pneumococcal Vaccine  Aged Out   Meningococcal B Vaccine  Aged Out    Discussed health benefits of physical activity, and encouraged her to engage in regular exercise appropriate for her age and condition.  Annual physical exam -     CBC with Differential/Platelet -     Comprehensive metabolic panel with GFR -     Lipid panel -     TSH -     Hemoglobin A1c  Chronic hypertension -     amLODIPine  Besylate; Take 1 tablet (10 mg total) by mouth daily.  Dispense: 90 tablet; Refill: 1 -     Comprehensive metabolic panel with  GFR  Numbness and tingling of right lower extremity -     Gabapentin ; Take 1 capsule (300 mg total) by mouth 3 (three) times daily.  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 Continue taking Amlodipine  and Gabapentin . Refilled medications. 4.Referral placed to orthopedics for right hip pain with muscles cramps and numbness/tingling of right leg. Patient has decided she would like to be seen by orthopedic for chronic right hip pain with numbness/tingling. Previously had  lumbar spine x-ray and right hip w/pelvis-both negative.  5.Ordered labs. Office will call with lab results and will be available via MyChart. She is taking Gabapentin  for the pain with some relief.  Return in about 6 months (around 08/30/2024) for chronic management.    Levon Boettcher, NP

## 2024-03-02 NOTE — Patient Instructions (Addendum)
-  It was great to see you today. -Influenza vaccine provided. -Continue taking Amlodipine  and Gabapentin . Refilled medications. -Referral placed to orthopedics for right hip pain with muscles cramps and numbness/tingling of right leg. Please call the office or send a MyChart message if you do not receive a phone call or a MyChart message about appointment in 2 weeks.  -Ordered labs. Office will call with lab results and will be available via MyChart. -Follow up in 6 months for chronic management.

## 2024-03-05 ENCOUNTER — Ambulatory Visit: Payer: Self-pay | Admitting: Family Medicine

## 2024-03-09 ENCOUNTER — Ambulatory Visit: Admitting: Orthopaedic Surgery

## 2024-03-09 DIAGNOSIS — M5416 Radiculopathy, lumbar region: Secondary | ICD-10-CM | POA: Diagnosis not present

## 2024-03-09 MED ORDER — METHOCARBAMOL 500 MG PO TABS: 500.0000 mg | ORAL_TABLET | Freq: Four times a day (QID) | ORAL | 2 refills | Status: AC | PRN

## 2024-03-09 MED ORDER — METHYLPREDNISOLONE 4 MG PO TBPK: ORAL_TABLET | ORAL | 2 refills | Status: AC

## 2024-03-09 NOTE — Progress Notes (Signed)
 Office Visit Note   Patient: Renee Carson           Date of Birth: April 20, 1981           MRN: [id number] Visit Date: 03/09/2024              Requested by: Billy Philippe SAUNDERS, NP [address] [place],  [place] [address] PCP: Billy Philippe SAUNDERS, NP   Assessment & Plan: Visit Diagnoses:  1. Radiculopathy, lumbar region     Plan: History of Present Illness [name] is a 43 year old female who presents with numbness and pain in the right leg.  She has experienced numbness in the entire right leg for over six months, with muscle aches from the knee to the ankle and a burning sensation at the bottom of her foot. Muscle spasms occur in the affected area. Intermittent back pain radiates down the leg, but even without back pain, she has pain, tingling, and numbness at the bottom of her foot. Gabapentin  has been prescribed. She faces challenges attending physical therapy due to work and childcare responsibilities. She walks frequently at work.  Exam of right hip is normal. Lumbar spine is slightly tender to palpation.  Positive straight leg raise sign.  Assessment and Plan Lumbar radiculopathy Chronic lumbar radiculopathy with right leg symptoms likely due to a pinched nerve, consistent with sciatica. Gabapentin  prescribed by primary care. - Prescribed medications for symptom management. - Referred to physical therapy, but patient states she will likely not attend.  Language barrier increased complexity of the visit.  Follow-Up Instructions: No follow-ups on file.   Orders:  No orders of the defined types were placed in this encounter.  No orders of the defined types were placed in this encounter.     Procedures: No procedures performed   Clinical Data: No additional findings.   Subjective: Chief Complaint  Patient presents with   Right Hip - Follow-up   Right Leg - Follow-up    HPI  Review of Systems  Constitutional: Negative.   HENT: Negative.    Eyes:  Negative.   Respiratory: Negative.    Cardiovascular: Negative.   Endocrine: Negative.   Musculoskeletal: Negative.   Neurological: Negative.   Hematological: Negative.   Psychiatric/Behavioral: Negative.    All other systems reviewed and are negative.    Objective: Vital Signs: There were no vitals taken for this visit.  Physical Exam Vitals and nursing note reviewed.  Constitutional:      Appearance: She is well-developed.  HENT:     Head: Atraumatic.     Nose: Nose normal.  Eyes:     Extraocular Movements: Extraocular movements intact.  Cardiovascular:     Pulses: Normal pulses.  Pulmonary:     Effort: Pulmonary effort is normal.  Abdominal:     Palpations: Abdomen is soft.  Musculoskeletal:     Cervical back: Neck supple.  Skin:    General: Skin is warm.     Capillary Refill: Capillary refill takes less than 2 seconds.  Neurological:     Mental Status: She is alert. Mental status is at baseline.  Psychiatric:        Behavior: Behavior normal.        Thought Content: Thought content normal.        Judgment: Judgment normal.     Ortho Exam  Specialty Comments:  No specialty comments available.  Imaging: No results found.   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 Declined cffdna Negative: cmp, tsh, pc ratio.    ASCUS of cervix with negative high risk HPV 05/12/2017      53mo ago     Adequacy  Satisfactory for evaluation  endocervical/transformation zone component PRESENT. Abnormal    Diagnosis  ATYPICAL SQUAMOUS CELLS OF UNDETERMINED SIGNIFICANCE (ASC-US ). Abnormal     Chlamydia  Negative   Comment: Normal Reference Range - Negative  Neisseria gonorrhea  Negative   Comment: Normal Reference Range - Negative  HPV  NOT DETECTED   Comment: Normal Reference Range -    Hypertension    resolved   Language barrier 06/02/2011   Postpartum hemorrhage 07/23/2017    Family History  Problem Relation Age of Onset   Diabetes Neg Hx    Hypertension Neg Hx    BRCA 1/2 Neg Hx    Breast cancer Neg Hx     Past Surgical History:  Procedure Laterality Date   BREAST BIOPSY Left 04/29/2023   MM LT BREAST BX W LOC DEV EA AD LESION IMG BX SPEC STEREO GUIDE 04/29/2023 [facility]   BREAST BIOPSY Left 04/29/2023   MM LT BREAST BX W LOC DEV 1ST LESION IMAGE BX SPEC STEREO GUIDE 04/29/2023 [facility]   NO PAST SURGERIES     PR INSERTION DRUG DELIVERY IMPLANT  12/05/2022   Social History   Occupational History   Not on file  Tobacco Use   Smoking status: Never   Smokeless tobacco: Never  Vaping Use   Vaping status: Never Used  Substance and Sexual Activity   Alcohol use: No   Drug use: No   Sexual activity: Not Currently    Birth control/protection: Implant

## 2024-04-10 NOTE — Therapy (Incomplete)
 OUTPATIENT PHYSICAL THERAPY THORACOLUMBAR EVALUATION   Patient Name: Renee Carson MRN: [id number] DOB:Feb 26, 1981, 43 y.o., female Today's Date: 04/10/2024  END OF SESSION:   Past Medical History:  Diagnosis Date   Advanced maternal age in multigravida, second trimester 04/22/2017   Normal anatomy. Declined cffdna Negative: cmp, tsh, pc ratio.    ASCUS of cervix with negative high risk HPV 05/12/2017      13mo ago     Adequacy  Satisfactory for evaluation  endocervical/transformation zone component PRESENT. Abnormal    Diagnosis  ATYPICAL SQUAMOUS CELLS OF UNDETERMINED SIGNIFICANCE (ASC-US ). Abnormal    Chlamydia  Negative   Comment: Normal Reference Range - Negative  Neisseria gonorrhea  Negative   Comment: Normal Reference Range - Negative  HPV  NOT DETECTED   Comment: Normal Reference Range -    Hypertension    resolved   Language barrier 06/02/2011   Postpartum hemorrhage 07/23/2017   Past Surgical History:  Procedure Laterality Date   BREAST BIOPSY Left 04/29/2023   MM LT BREAST BX W LOC DEV EA AD LESION IMG BX SPEC STEREO GUIDE 04/29/2023 [facility]   BREAST BIOPSY Left 04/29/2023   MM LT BREAST BX W LOC DEV 1ST LESION IMAGE BX SPEC STEREO GUIDE 04/29/2023 [facility]   NO PAST SURGERIES     PR INSERTION DRUG DELIVERY IMPLANT  12/05/2022   Patient Active Problem List   Diagnosis Date Noted   Radiculopathy, lumbar region 03/09/2024   Insertion of implantable subdermal contraceptive 12/05/2022   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  ONSET DATE: ***  SUBJECTIVE:                                                                                                                                                                                            SUBJECTIVE STATEMENT: ***  PERTINENT HISTORY:  ***  PAIN:  Are you having pain? Yes: NPRS scale: *** Pain location: *** Pain description: *** Aggravating factors: *** Relieving factors: ***  PRECAUTIONS: {Therapy precautions:24002}  RED FLAGS: {PT Red Flags:29287}   WEIGHT BEARING RESTRICTIONS: {Yes ***/No:24003}  FALLS:  Has patient fallen in last 6 months? {fallsyesno:27318}  LIVING ENVIRONMENT: Lives with: {[facility] lives with:25569::lives with their family} Lives in: {Lives in:25570} Stairs: {opstairs:27293} Has following equipment at home: {Assistive devices:23999}  OCCUPATION: ***  PLOF: {PLOF:24004}  PATIENT GOALS: ***  NEXT MD VISIT: ***  OBJECTIVE:  Note: Objective measures were completed at Evaluation unless otherwise noted.  DIAGNOSTIC FINDINGS:  03/22/24  DG Lumbar Spine IMPRESSION: Negative.  PATIENT SURVEYS:  {rehab surveys:24030}  COGNITION: Overall cognitive status: {cognition:24006}     SENSATION: {sensation:27233}  MUSCLE LENGTH: Hamstrings: Right *** deg; Left *** deg Debby test: Right *** deg; Left *** deg  POSTURE: {posture:25561}  PALPATION: ***  LUMBAR ROM:   AROM eval  Flexion   Extension   Right lateral flexion   Left lateral flexion   Right rotation   Left rotation    (Blank rows = not tested)  LOWER EXTREMITY ROM:     {AROM/PROM:27142}  Right eval Left eval  Hip flexion    Hip extension    Hip abduction    Hip adduction    Hip internal rotation    Hip external rotation    Knee flexion    Knee extension    Ankle dorsiflexion    Ankle plantarflexion    Ankle inversion    Ankle eversion     (Blank rows = not tested)  LOWER EXTREMITY MMT:    MMT Right eval Left eval  Hip flexion    Hip extension    Hip abduction    Hip adduction    Hip internal rotation    Hip external rotation     Knee flexion    Knee extension    Ankle dorsiflexion    Ankle plantarflexion    Ankle inversion    Ankle eversion     (Blank rows = not tested)  LUMBAR SPECIAL TESTS:  {lumbar special test:25242}  FUNCTIONAL TESTS:  {Functional tests:24029}  GAIT: Distance walked: *** Assistive device utilized: {Assistive devices:23999} Level of assistance: {Levels of assistance:24026} Comments: ***  TREATMENT DATE:  [facility] Adult PT Treatment:                                                DATE: 04/11/24 Therapeutic Exercise: *** Manual Therapy: *** Neuromuscular re-ed: *** Therapeutic Activity: *** Modalities: *** Self Care: ***                                                                                                                                PATIENT EDUCATION:  Education details: *** Person educated: {Person educated:25204} Education method: {Education Method:25205} Education comprehension: {Education Comprehension:25206}  HOME EXERCISE PROGRAM: ***  ASSESSMENT:  CLINICAL IMPRESSION: Patient is a 43 y.o. female who was seen today for physical therapy evaluation and treatment for M54.16 (ICD-10-CM) - Radiculopathy, lumbar region .   OBJECTIVE IMPAIRMENTS: {opptimpairments:25111}.   ACTIVITY LIMITATIONS: {activitylimitations:27494}  PARTICIPATION LIMITATIONS: {participationrestrictions:25113}  PERSONAL FACTORS: {Personal factors:25162} are also affecting patient's functional outcome.   REHAB POTENTIAL: {rehabpotential:25112}  CLINICAL DECISION MAKING: {clinical  decision making:25114}  EVALUATION COMPLEXITY: {Evaluation complexity:25115}   GOALS:   SHORT TERM GOALS: Target date: ***  Pt will be Ind in an initial HEP  Baseline: started Goal status: INITIAL  2.  *** Baseline:  Goal status: INITIAL  3.  *** Baseline:  Goal status: INITIAL  4.  *** Baseline:  Goal status: INITIAL  5.  *** Baseline:  Goal status: INITIAL  6.  *** Baseline:   Goal status: INITIAL  LONG TERM GOALS: Target date: ***  Pt will be Ind in a final HEP to maintain achieved LOF  Baseline: started Goal status: INITIAL  2.  *** Baseline:  Goal status: INITIAL  3.  *** Baseline:  Goal status: INITIAL  4.  *** Baseline:  Goal status: INITIAL  5.  *** Baseline:  Goal status: INITIAL  6.  *** Baseline:  Goal status: INITIAL  PLAN:  PT FREQUENCY: {rehab frequency:25116}  PT DURATION: {rehab duration:25117}  PLANNED INTERVENTIONS: {rehab planned interventions:25118::97110-Therapeutic exercises,97530- Therapeutic [phone]- Neuromuscular re-education,97535- Self Rjmz,02859- Manual therapy,Patient/Family education}.  PLAN FOR NEXT SESSION: ***   Dasie Daft, PT 04/10/2024, 10:13 AM

## 2024-04-12 ENCOUNTER — Ambulatory Visit: Attending: Family Medicine

## 2024-08-31 ENCOUNTER — Ambulatory Visit: Admitting: Family Medicine
